# Patient Record
Sex: Male | Born: 2000 | Race: White | Hispanic: No | Marital: Single | Smoking: Never smoker
Health system: Southern US, Community
[De-identification: ages and names within clinical notes are randomized; demographics above are authoritative.]

---
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
## 2020-01-20 ENCOUNTER — Ambulatory Visit (INDEPENDENT_AMBULATORY_CARE_PROVIDER_SITE_OTHER): Payer: No Typology Code available for payment source | Admitting: Family

## 2020-01-20 ENCOUNTER — Ambulatory Visit: Payer: No Typology Code available for payment source | Admitting: Family

## 2020-01-20 ENCOUNTER — Encounter: Payer: Self-pay | Admitting: Family

## 2020-01-20 DIAGNOSIS — M25561 Pain in right knee: Secondary | ICD-10-CM

## 2020-01-20 DIAGNOSIS — R5383 Other fatigue: Secondary | ICD-10-CM | POA: Diagnosis not present

## 2020-01-20 NOTE — Progress Notes (Signed)
   Virtual Visit via telephone Note Due to COVID-19 pandemic this visit was conducted virtually. This visit type was conducted due to national recommendations for restrictions regarding the COVID-19 Pandemic (e.g. social distancing, sheltering in place) in an effort to limit this patient's exposure and mitigate transmission in our community. All issues noted in this document were discussed and addressed.  A physical exam was not performed with this format.  I connected with Jonathan Ford on 01/20/20 at 12:34 pm  by telephone and verified that I am speaking with the correct person using two identifiers. Jonathan Ford is currently located at home and no one is currently with  Him  during visit. The provider, Evelina Dun, FNP is located in their office at time of visit.  I discussed the limitations, risks, security and privacy concerns of performing an evaluation and management service by telephone and the availability of in person appointments. I also discussed with the patient that there may be a patient responsible charge related to this service. The patient expressed understanding and agreed to proceed.   History and Present Illness:  PT calls the office today with chronic right knee pain that has been on and off the last year. He denies any injury, but wrestles at school.   He reports he has had an episode when riding in the car he felt "werid" and "out of body experience". He states for 5 mins he could not move or talk. He became fatigued.  Then every thing resolved and denies any problems now. Denies any changes in vision, gait, or speech. No hx of seizures.   Knee Pain  The incident occurred more than 1 week ago. Injury mechanism: no  injury that he is aware of, but wrestles. The pain is present in the right knee. The pain is at a severity of 7/10. The pain is moderate. The pain has been intermittent since onset. Pertinent negatives include no loss of motion, muscle weakness, numbness or  tingling. He reports no foreign bodies present. The symptoms are aggravated by movement. He has tried NSAIDs and rest for the symptoms. The treatment provided mild relief.    Review of Systems  Neurological: Negative for tingling and numbness.     Observations/Objective: No SOB or distress noted  Assessment and Plan: 1. Acute pain of right knee X-ray pending If negative may need MRI - CMP14+EGFR; Future - DG Knee 1-2 Views Right; Future  2. Fatigue, unspecified type Unsure what happened seizures or "car sick" Labs pending   - Anemia Profile B; Future - CMP14+EGFR; Future - TSH; Future    I discussed the assessment and treatment plan with the patient. The patient was provided an opportunity to ask questions and all were answered. The patient agreed with the plan and demonstrated an understanding of the instructions.   The patient was advised to call back or seek an in-person evaluation if the symptoms worsen or if the condition fails to improve as anticipated.  The above assessment and management plan was discussed with the patient. The patient verbalized understanding of and has agreed to the management plan. Patient is aware to call the clinic if symptoms persist or worsen. Patient is aware when to return to the clinic for a follow-up visit. Patient educated on when it is appropriate to go to the emergency department.   Time call ended:  12:45 pm   I provided 12 minutes of non-face-to-face time during this encounter.    Evelina Dun, FNP

## 2020-01-21 ENCOUNTER — Other Ambulatory Visit: Payer: No Typology Code available for payment source

## 2020-01-21 ENCOUNTER — Ambulatory Visit (INDEPENDENT_AMBULATORY_CARE_PROVIDER_SITE_OTHER): Payer: No Typology Code available for payment source

## 2020-01-21 ENCOUNTER — Other Ambulatory Visit: Payer: Self-pay

## 2020-01-21 DIAGNOSIS — M25561 Pain in right knee: Secondary | ICD-10-CM

## 2020-01-21 DIAGNOSIS — R5383 Other fatigue: Secondary | ICD-10-CM

## 2020-01-22 ENCOUNTER — Other Ambulatory Visit: Payer: Self-pay

## 2020-01-22 ENCOUNTER — Other Ambulatory Visit: Payer: No Typology Code available for payment source

## 2020-01-22 DIAGNOSIS — R509 Fever, unspecified: Secondary | ICD-10-CM

## 2020-01-22 DIAGNOSIS — J029 Acute pharyngitis, unspecified: Secondary | ICD-10-CM

## 2020-01-22 LAB — ANEMIA PROFILE B
Basophils Absolute: 0 10*3/uL (ref 0.0–0.2)
Basos: 1 %
EOS (ABSOLUTE): 0.1 10*3/uL (ref 0.0–0.4)
Eos: 2 %
Ferritin: 57 ng/mL (ref 16–124)
Folate: 2.3 ng/mL — ABNORMAL LOW (ref >3.0)
Hematocrit: 47.5 % (ref 37.5–51.0)
Hemoglobin: 15.7 g/dL (ref 13.0–17.7)
Immature Grans (Abs): 0 10*3/uL (ref 0.0–0.1)
Immature Granulocytes: 0 %
Iron Saturation: 34 % (ref 15–55)
Iron: 107 ug/dL (ref 38–169)
Lymphocytes Absolute: 1.3 10*3/uL (ref 0.7–3.1)
Lymphs: 28 %
MCH: 27.2 pg (ref 26.6–33.0)
MCHC: 33.1 g/dL (ref 31.5–35.7)
MCV: 82 fL (ref 79–97)
Monocytes Absolute: 0.5 10*3/uL (ref 0.1–0.9)
Monocytes: 10 %
Neutrophils Absolute: 2.7 10*3/uL (ref 1.4–7.0)
Neutrophils: 59 %
Platelets: 248 10*3/uL (ref 150–450)
RBC: 5.78 x10E6/uL (ref 4.14–5.80)
RDW: 14.5 % (ref 11.6–15.4)
Retic Ct Pct: 0.8 % (ref 0.6–2.6)
Total Iron Binding Capacity: 311 ug/dL (ref 250–450)
UIBC: 204 ug/dL (ref 111–343)
Vitamin B-12: 373 pg/mL (ref 232–1245)
WBC: 4.5 10*3/uL (ref 3.4–10.8)

## 2020-01-22 LAB — CMP14+EGFR
ALT: 23 IU/L (ref 0–44)
AST: 19 IU/L (ref 0–40)
Albumin/Globulin Ratio: 2.3 — ABNORMAL HIGH (ref 1.2–2.2)
Albumin: 4.5 g/dL (ref 4.1–5.2)
Alkaline Phosphatase: 131 IU/L — ABNORMAL HIGH (ref 51–125)
BUN/Creatinine Ratio: 8 — ABNORMAL LOW (ref 9–20)
BUN: 8 mg/dL (ref 6–20)
Bilirubin Total: 0.4 mg/dL (ref 0.0–1.2)
CO2: 27 mmol/L (ref 20–29)
Calcium: 9.5 mg/dL (ref 8.7–10.2)
Chloride: 100 mmol/L (ref 96–106)
Creatinine, Ser: 1.06 mg/dL (ref 0.76–1.27)
GFR calc Af Amer: 117 mL/min/{1.73_m2} (ref >59)
GFR calc non Af Amer: 101 mL/min/{1.73_m2} (ref >59)
Globulin, Total: 2 g/dL (ref 1.5–4.5)
Glucose: 72 mg/dL (ref 65–99)
Potassium: 4.1 mmol/L (ref 3.5–5.2)
Sodium: 138 mmol/L (ref 134–144)
Total Protein: 6.5 g/dL (ref 6.0–8.5)

## 2020-01-22 LAB — TSH: TSH: 1.94 u[IU]/mL (ref 0.450–4.500)

## 2020-01-22 LAB — CULTURE, GROUP A STREP

## 2020-01-22 LAB — RAPID STREP SCREEN (MED CTR MEBANE ONLY): Strep Gp A Ag, IA W/Reflex: NEGATIVE

## 2020-01-22 LAB — VERITOR FLU A/B WAIVED
Influenza A: NEGATIVE
Influenza B: NEGATIVE

## 2020-01-23 ENCOUNTER — Encounter: Payer: Self-pay | Admitting: Family

## 2020-01-23 ENCOUNTER — Ambulatory Visit (INDEPENDENT_AMBULATORY_CARE_PROVIDER_SITE_OTHER): Payer: No Typology Code available for payment source | Admitting: Family

## 2020-01-23 DIAGNOSIS — J069 Acute upper respiratory infection, unspecified: Secondary | ICD-10-CM | POA: Diagnosis not present

## 2020-01-23 LAB — NOVEL CORONAVIRUS, NAA: SARS-CoV-2, NAA: NOT DETECTED

## 2020-01-23 LAB — SARS-COV-2, NAA 2 DAY TAT

## 2020-01-23 MED ORDER — FLUTICASONE PROPIONATE 50 MCG/ACT NA SUSP: 2.0000 | Freq: Every day | NASAL | 6 refills | Status: DC

## 2020-01-23 MED ORDER — BENZONATATE 200 MG PO CAPS: 200.0000 mg | ORAL_CAPSULE | Freq: Three times a day (TID) | ORAL | 1 refills | Status: DC | PRN

## 2020-01-23 NOTE — Progress Notes (Signed)
   Virtual Visit via telephone Note Due to COVID-19 pandemic this visit was conducted virtually. This visit type was conducted due to national recommendations for restrictions regarding the COVID-19 Pandemic (e.g. social distancing, sheltering in place) in an effort to limit this patient's exposure and mitigate transmission in our community. All issues noted in this document were discussed and addressed.  A physical exam was not performed with this format.  I connected with Jonathan Ford on 01/23/20 at 9:03 AM  by telephone and verified that I am speaking with the correct person using two identifiers. Jonathan Ford is currently located at home and no one is currently with him  during visit. The provider, Jannifer Rodney, FNP is located in their office at time of visit.  I discussed the limitations, risks, security and privacy concerns of performing an evaluation and management service by telephone and the availability of in person appointments. I also discussed with the patient that there may be a patient responsible charge related to this service. The patient expressed understanding and agreed to proceed.   History and Present Illness:  Pt calls the office today with sore throat and cough that started yesterday. He was strep and flu negative. His COVID test is still pending. Sore Throat  This is a new problem. The current episode started yesterday. The problem has been gradually worsening. Maximum temperature: reports 103. The pain is at a severity of 4/10. The pain is moderate. Associated symptoms include congestion, coughing, headaches, a hoarse voice and swollen glands. Pertinent negatives include no ear discharge, ear pain, shortness of breath or trouble swallowing. He has had no exposure to strep. He has tried acetaminophen and NSAIDs for the symptoms. The treatment provided mild relief.       Review of Systems  HENT: Positive for congestion and hoarse voice. Negative for ear discharge, ear  pain and trouble swallowing.   Respiratory: Positive for cough. Negative for shortness of breath.   Neurological: Positive for headaches.  All other systems reviewed and are negative.    Observations/Objective: No SOB or distress noted, hoarse voice  Assessment and Plan: 1. Viral URI COVID still pending Quarantine until results return Force fluids Rest Tylenol as needed  - benzonatate (TESSALON) 200 MG capsule; Take 1 capsule (200 mg total) by mouth 3 (three) times daily as needed.  Dispense: 30 capsule; Refill: 1 - fluticasone (FLONASE) 50 MCG/ACT nasal spray; Place 2 sprays into both nostrils daily.  Dispense: 16 g; Refill: 6     I discussed the assessment and treatment plan with the patient. The patient was provided an opportunity to ask questions and all were answered. The patient agreed with the plan and demonstrated an understanding of the instructions.   The patient was advised to call back or seek an in-person evaluation if the symptoms worsen or if the condition fails to improve as anticipated.  The above assessment and management plan was discussed with the patient. The patient verbalized understanding of and has agreed to the management plan. Patient is aware to call the clinic if symptoms persist or worsen. Patient is aware when to return to the clinic for a follow-up visit. Patient educated on when it is appropriate to go to the emergency department.   Time call ended:  9:15 AM   I provided 12 minutes of non-face-to-face time during this encounter.    Jannifer Rodney, FNP

## 2020-01-24 ENCOUNTER — Telehealth: Payer: Self-pay

## 2020-01-24 MED ORDER — AMOXICILLIN 500 MG PO CAPS: 500.0000 mg | ORAL_CAPSULE | Freq: Two times a day (BID) | ORAL | 0 refills | Status: AC

## 2020-01-24 NOTE — Telephone Encounter (Signed)
Amoxicillin Prescription sent to pharmacy. Please make sure he gets a new toothbrush in  3 days.

## 2020-01-24 NOTE — Telephone Encounter (Signed)
Mother aware

## 2021-01-04 ENCOUNTER — Encounter: Payer: Self-pay | Admitting: Family

## 2021-01-04 ENCOUNTER — Ambulatory Visit (INDEPENDENT_AMBULATORY_CARE_PROVIDER_SITE_OTHER): Payer: No Typology Code available for payment source | Admitting: Family

## 2021-01-04 VITALS — BP 127/74 | HR 54 | Temp 98.1°F | Ht 73.0 in | Wt 199.0 lb

## 2021-01-04 DIAGNOSIS — M25562 Pain in left knee: Secondary | ICD-10-CM | POA: Diagnosis not present

## 2021-01-04 DIAGNOSIS — G8929 Other chronic pain: Secondary | ICD-10-CM

## 2021-01-04 DIAGNOSIS — Y9372 Activity, wrestling: Secondary | ICD-10-CM

## 2021-01-04 MED ORDER — DICLOFENAC SODIUM 75 MG PO TBEC: 75.0000 mg | DELAYED_RELEASE_TABLET | Freq: Two times a day (BID) | ORAL | 1 refills | Status: DC

## 2021-01-04 NOTE — Patient Instructions (Signed)
Chronic Knee Pain, Adult °Chronic knee pain is pain in one or both knees that lasts longer than 3 months. Symptoms of chronic knee pain may include swelling, stiffness, and discomfort. Age-related wear and tear (osteoarthritis) of the knee joint is the most common cause of chronic knee pain. Other possible causes include: °A long-term immune-related disease that causes inflammation of the knee (rheumatoid arthritis). This usually affects both knees. °Inflammatory arthritis, such as gout or pseudogout. °An injury to the knee that causes arthritis. °An injury to the knee that damages the ligaments. Ligaments are strong tissues that connect bones to each other. °Runner's knee or pain behind the kneecap. °Treatment for chronic knee pain depends on the cause. The main treatments for chronic knee pain are physical therapy and weight loss. This condition may also be treated with medicines, injections, a knee sleeve or brace, and by using crutches. Rest, ice, pressure (compression), and elevation, also known as RICE therapy, may also be recommended. °Follow these instructions at home: °If you have a knee sleeve or brace: ° °Wear the knee sleeve or brace as told by your health care provider. Remove it only as told by your health care provider. °Loosen it if your toes tingle, become numb, or turn cold and blue. °Keep it clean. °If the sleeve or brace is not waterproof: °Do not let it get wet. °Remove it if allowed by your health care provider, or cover it with a watertight covering when you take a bath or a shower. °Managing pain, stiffness, and swelling °  °If directed, apply heat to the affected area as often as told by your health care provider. Use the heat source that your health care provider recommends, such as a moist heat pack or a heating pad. °If you have a removable knee sleeve or brace, remove it as told by your health care provider. °Place a towel between your skin and the heat source. °Leave the heat on for  20-30 minutes. °Remove the heat if your skin turns bright red. This is especially important if you are unable to feel pain, heat, or cold. You may have a greater risk of getting burned. °If directed, put ice on the affected area. To do this: °If you have a removable knee sleeve or brace, remove it as told by your health care provider. °Put ice in a plastic bag. °Place a towel between your skin and the bag. °Leave the ice on for 20 minutes, 2-3 times a day. °Remove the ice if your skin turns bright red. This is very important. If you cannot feel pain, heat, or cold, you have a greater risk of damage to the area. °Move your toes often to reduce stiffness and swelling. °Raise (elevate) the injured area above the level of your heart while you are sitting or lying down. °Activity °Avoid high-impact activities or exercises, such as running, jumping rope, or doing jumping jacks. °Follow the exercise plan that your health care provider designed for you. Your health care provider may suggest that you: °Avoid activities that make knee pain worse. This may require you to change your exercise routines, sport participation, or job duties. °Wear shoes with cushioned soles. °Avoid sports that require running and sudden changes in direction. °Do physical therapy. Physical therapy is planned to match your needs and abilities. It may include exercises for strength, flexibility, stability, and endurance. °Do exercises that increase balance and strength, such as tai chi and yoga. °Do not use the injured limb to support your body weight   until your health care provider says that you can. Use crutches as told by your health care provider. °Return to your normal activities as told by your health care provider. Ask your health care provider what activities are safe for you. °General instructions °Take over-the-counter and prescription medicines only as told by your health care provider. °Lose weight if you are overweight. Losing even a  little weight can reduce knee pain. Ask your health care provider what your ideal weight is, and how to safely lose extra weight. A dietitian may be able to help you plan your meals. °Do not use any products that contain nicotine or tobacco, such as cigarettes, e-cigarettes, and chewing tobacco. These can delay healing. If you need help quitting, ask your health care provider. °Keep all follow-up visits. This is important. °Contact a health care provider if: °You have knee pain that is not getting better or gets worse. °You are unable to do your physical therapy exercises due to knee pain. °Get help right away if: °Your knee swells and the swelling becomes worse. °You cannot move your knee. °You have severe knee pain. °Summary °Knee pain that lasts more than 3 months is considered chronic knee pain. °The main treatments for chronic knee pain are physical therapy and weight loss. You may also need to take medicines, wear a knee sleeve or brace, use crutches, and apply ice or heat. °Losing even a little weight can reduce knee pain. Ask your health care provider what your ideal weight is, and how to safely lose extra weight. A dietitian may be able to help you plan your meals. °Follow the exercise plan that your health care provider designed for you. °This information is not intended to replace advice given to you by your health care provider. Make sure you discuss any questions you have with your health care provider. °Document Revised: 06/19/2019 Document Reviewed: 06/19/2019 °Elsevier Patient Education © 2022 [employer] ° °

## 2021-01-04 NOTE — Progress Notes (Signed)
Subjective:    Patient ID: Jonathan Ford, male    DOB: 2000/12/14, 20 y.o.   MRN: [id number]  Chief Complaint  Patient presents with   Knee Pain    Both knees   Pt presents to the office today with left knee pain that has been on going on and off over the last two years. 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 Pain is worse when walking down stairs. He has taken NSAID's without relief and has done stretching without relief.   Knee Pain  The incident occurred more than 1 week ago. Injury mechanism: Wrestling. The pain is present in the left knee. The quality of the pain is described as aching. The pain is at a severity of 7/10. The pain is moderate. The pain has been Intermittent since onset. Pertinent negatives include no inability to bear weight, loss of motion, loss of sensation, numbness or tingling. He reports no foreign bodies present. The symptoms are aggravated by weight bearing (walking down stairs). He has tried NSAIDs for the symptoms. The treatment provided mild relief.     Review of Systems  Neurological:  Negative for tingling and numbness.  All other systems reviewed and are negative.     Objective:   Physical Exam Vitals reviewed.  Constitutional:      General: He is not in acute distress.    Appearance: He is well-developed.  HENT:     Head: Normocephalic.  Eyes:     General:        Right eye: No discharge.        Left eye: No discharge.     Pupils: Pupils are equal, round, and reactive to light.  Neck:     Thyroid: No thyromegaly.  Cardiovascular:     Rate and Rhythm: Normal rate and regular rhythm.     Heart sounds: Normal heart sounds. No murmur heard. Pulmonary:     Effort: Pulmonary effort is normal. No respiratory distress.     Breath sounds: Normal breath sounds. No wheezing.  Abdominal:     General: Bowel sounds are normal. There is no distension.     Palpations: Abdomen is soft.     Tenderness: There is no abdominal tenderness.  Musculoskeletal:         General: No tenderness. Normal range of motion.     Cervical back: Normal range of motion and neck supple.     Comments: Full ROM, pain in posterior knee with flexion and extension   Skin:    General: Skin is warm and dry.     Findings: No erythema or rash.  Neurological:     Mental Status: He is alert and oriented to person, place, and time.     Cranial Nerves: No cranial nerve deficit.     Deep Tendon Reflexes: Reflexes are normal and symmetric.  Psychiatric:        Behavior: Behavior normal.        Thought Content: Thought content normal.        Judgment: Judgment normal.     BP 127/74    Pulse (!) 54    Temp 98.1 F (36.7 C) (Temporal)    Ht 6\' 1"  (1.854 m)    Wt 199 lb (90.3 kg)    BMI 26.25 kg/m       Assessment & Plan:   Jonathan Ford comes in today with chief complaint of Knee Pain (Both knees )   Diagnosis and orders addressed:  1. Chronic pain of left  knee - MR Knee Left  Wo Contrast; Future - diclofenac (VOLTAREN) 75 MG EC tablet; Take 1 tablet (75 mg total) by mouth 2 (two) times daily.  Dispense: 60 tablet; Refill: 1  2. Injury while wrestling - MR Knee Left  Wo Contrast; Future - diclofenac (VOLTAREN) 75 MG EC tablet; Take 1 tablet (75 mg total) by mouth 2 (two) times daily.  Dispense: 60 tablet; Refill: 1  Given length of pain, will order MRI Start diclofenac BID  No other NSAIDs Follow up if symptoms worsen or do not improve   Jannifer Rodney, FNP

## 2021-01-20 ENCOUNTER — Other Ambulatory Visit: Payer: Self-pay

## 2021-01-20 ENCOUNTER — Ambulatory Visit (HOSPITAL_COMMUNITY)
Admission: RE | Admit: 2021-01-20 | Discharge: 2021-01-20 | Disposition: A | Discharge status: Home / Self Care | Payer: No Typology Code available for payment source | Source: Ambulatory Visit | Attending: Family | Admitting: Family

## 2021-01-20 DIAGNOSIS — Y9372 Activity, wrestling: Secondary | ICD-10-CM | POA: Insufficient documentation

## 2021-01-20 DIAGNOSIS — G8929 Other chronic pain: Secondary | ICD-10-CM | POA: Insufficient documentation

## 2021-01-20 DIAGNOSIS — M25562 Pain in left knee: Secondary | ICD-10-CM | POA: Diagnosis not present

## 2021-01-20 DIAGNOSIS — S83242A Other tear of medial meniscus, current injury, left knee, initial encounter: Secondary | ICD-10-CM | POA: Insufficient documentation

## 2021-01-21 ENCOUNTER — Other Ambulatory Visit: Payer: Self-pay | Admitting: Family

## 2021-01-21 DIAGNOSIS — S83249A Other tear of medial meniscus, current injury, unspecified knee, initial encounter: Secondary | ICD-10-CM

## 2021-01-27 ENCOUNTER — Ambulatory Visit: Payer: No Typology Code available for payment source | Attending: Family Medicine

## 2021-01-27 ENCOUNTER — Other Ambulatory Visit: Payer: Self-pay

## 2021-01-27 DIAGNOSIS — G8929 Other chronic pain: Secondary | ICD-10-CM | POA: Insufficient documentation

## 2021-01-27 DIAGNOSIS — M25562 Pain in left knee: Secondary | ICD-10-CM | POA: Diagnosis present

## 2021-01-27 NOTE — Therapy (Addendum)
[facility] [place], [place], [address] Phone: [phone]   Fax:  [phone]  Physical Therapy Evaluation  Patient Details  Name: Jonathan Ford MRN: [id number] Date of Birth: November 26, 2000 Referring Provider (PT): Garlan Fillers, MD   Encounter Date: 01/27/2021   PT End of Session - 01/27/21 1258     Visit Number 1    Number of Visits 1    PT Start Time 9528    PT Stop Time 4132    PT Time Calculation (min) 48 min    Activity Tolerance Patient tolerated treatment well;No increased pain    Behavior During Therapy [facility] for tasks assessed/performed             History reviewed. No pertinent past medical history.  History reviewed. No pertinent surgical history.  There were no vitals filed for this visit.    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 He notes that his pain has gotten substantially worse in the past month and a half. He is unsure of why his pain got worse about 6 weeks ago. He feels that his left knee may give out sometimes, but it has yet to do so.    Limitations Standing    Patient Stated Goals reduced pain, improved strength    Currently in Pain? Yes    Pain Score 8     Pain Location Knee    Pain Orientation Left;Anterior;Posterior    Pain Descriptors / Indicators Aching;Sharp;Shooting    Pain Type Chronic pain    Pain Radiating Towards radiates up his left IT band    Pain Onset More than a month ago    Pain Frequency Intermittent    Aggravating Factors  prolonged standing, sitting, walking down stairs    Pain Relieving Factors rest, laying down    Effect of Pain on Daily Activities able to do everything with increased difficulty                [facility] PT Assessment - 01/27/21 0001        Assessment   Medical Diagnosis Patellofemoral stress syndrome    Referring Provider (PT) Garlan Fillers, MD    Onset Date/Surgical Date --   2 years ago   Next MD Visit not scheduled yet    Prior Therapy No      Precautions   Precautions None      Restrictions   Weight Bearing Restrictions No      Balance Screen   Has the patient fallen in the past 6 months No    Has the patient had a decrease in activity level because of a fear of falling?  No    Is the patient reluctant to leave their home because of a fear of falling?  No      Home Ecologist residence      Prior Function   Level of Independence Independent    Vocation Student    Leisure work out, Secondary school teacher   Overall Cognitive Status Within Functional Limits for tasks assessed    Attention Focused    Focused Attention Appears intact    Memory Appears intact    Awareness Appears intact    Problem Solving Appears intact  Sensation   Additional Comments Patient reports no numbness or tingling      ROM / Strength   AROM / PROM / Strength Strength;AROM      AROM   Overall AROM  Within functional limits for tasks performed    Overall AROM Comments No limitations with left knee ROM: pain at end range knee flexion    AROM Assessment Site Knee      Strength   Strength Assessment Site Knee    Right/Left Knee Left;Right    Right Knee Flexion 5/5    Right Knee Extension 5/5    Left Knee Flexion 5/5   posterior knee pain   Left Knee Extension 4/5      Palpation   Patella mobility Left: WFL and nonpainful   Tibiofemoral: WFL and nonpainful   Palpation comment TTP: left hamstring and proximal gastroc      Ambulation/Gait   Ambulation/Gait Yes    Ambulation/Gait Assistance 7: Independent    Gait Pattern Within Functional Limits    Ambulation Surface Level;Indoor                        Objective measurements completed on examination: See above findings.        [facility] PT Treatment/Exercise - 01/27/21 0001       Exercises   Exercises Knee/Hip      Knee/Hip Exercises: Stretches   Passive Hamstring Stretch Left;3 reps;30 seconds    Gastroc Stretch Left;3 reps;30 seconds      Knee/Hip Exercises: Aerobic   Recumbent Bike L7 x 5 minutes      Knee/Hip Exercises: Standing   Forward Lunges Both;15 reps    Lateral Step Up Left;15 reps;Hand Hold: 0;Step Height: 6"    Functional Squat 15 reps   single leg; LLE only   Other Standing Knee Exercises Side stepping   green t-band; 2 minutes   Other Standing Knee Exercises Monster walk   green t-band; 2 minutes                    PT Education - 01/27/21 1404     Education Details ways to progress his HEP and multiple ways to modify his HEP, as needed    Person(s) Educated Patient    Methods Explanation    Comprehension Verbalized understanding                 PT Long Term Goals - 01/27/21 1409       PT LONG TERM GOAL #1   Title Patient will be able to safely demonstrate his HEP    Status Achieved                    Plan - 01/27/21 1406     Clinical Impression Statement Patient is a 21 year old male presenting to physical therapy with chronic left knee pain secondary to a meniscal tear and patellofemoral stress syndrome. He has experienced this pain for more than two years, but he experienced an exacerbation in these symptoms approximately six weeks ago with no known cause. He was provided a substantial HEP as he is only able to attend the initial evaluation as he returns to school this weekend and his insurance is out of network for the local physical therapy offices. He was able to safely and properly demonstrate his HEP without any exacerbation to his familiar symptoms. He was educated on ways to modify and progress his HEP  and how to safely return to his gym. He reported feeling comfortable with these interventions and being discharged at this time.     Personal Factors and Comorbidities Other;Time since onset of injury/illness/exacerbation    Examination-Activity Limitations Locomotion Level;Stairs;Sit;Lift    Examination-Participation Restrictions School;Community Activity    Stability/Clinical Decision Making Stable/Uncomplicated    Clinical Decision Making Low    Rehab Potential Good    PT Frequency 1x / week    PT Duration Other (comment)   1 week   PT Treatment/Interventions Therapeutic exercise;Therapeutic activities;Stair training;Patient/family education    PT Home Exercise Plan [place] access code: [email or web address]    Consulted and Agree with Plan of Care Patient             Patient will benefit from skilled therapeutic intervention in order to improve the following deficits and impairments:  Decreased activity tolerance, Pain, Decreased strength, Decreased mobility  Visit Diagnosis: Chronic pain of left knee     Problem List Patient Active Problem List   Diagnosis Date Noted   Concussion with no loss of consciousness 12/24/2015   History of concussion 12/24/2015    Darlin Coco, PT 01/27/2021, 2:30 PM  [facility] [place], [place], [address] Phone: [phone]   Fax:  [phone]  Name: Jonathan Ford MRN: [id number] Date of Birth: [birth year]  PHYSICAL THERAPY DISCHARGE SUMMARY  Visits from Start of Care: 1  Current functional level related to goals / functional outcomes: See clinical impression statement    Remaining deficits: See clinical impression statement    Education / Equipment: HEP    Patient agrees to discharge. Patient goals were met. Patient is being discharged due to meeting the stated rehab goals.

## 2021-03-12 ENCOUNTER — Ambulatory Visit (INDEPENDENT_AMBULATORY_CARE_PROVIDER_SITE_OTHER): Payer: No Typology Code available for payment source | Admitting: Family Medicine

## 2021-03-12 ENCOUNTER — Encounter: Payer: Self-pay | Admitting: Family Medicine

## 2021-03-12 DIAGNOSIS — L0291 Cutaneous abscess, unspecified: Secondary | ICD-10-CM

## 2021-03-12 MED ORDER — SULFAMETHOXAZOLE-TRIMETHOPRIM 800-160 MG PO TABS: 1.0000 | ORAL_TABLET | Freq: Two times a day (BID) | ORAL | 0 refills | Status: AC

## 2021-03-12 NOTE — Progress Notes (Signed)
° °  Virtual Visit via Telephone Note  I connected with Jonathan Ford on 03/12/21 at 3:12 PM by telephone and verified that I am speaking with the correct person using two identifiers. Jonathan Ford is currently located in his vehicle and nobody is currently with him during this visit. The provider, Loman Brooklyn, FNP is located in their office at time of visit.  I discussed the limitations, risks, security and privacy concerns of performing an evaluation and management service by telephone and the availability of in person appointments. I also discussed with the patient that there may be a patient responsible charge related to this service. The patient expressed understanding and agreed to proceed.  Subjective: PCP: Janora Norlander, DO  Chief Complaint  Patient presents with   Cyst   Patient reports he believes he has a pilonidal cyst at the top of his intergluteal cleft. He reports similar symptoms two years ago which resolved spontaneously. He is having swelling, pain, and drainage that is sometimes purulent and sometimes bloody.   ROS: Per HPI  Current Outpatient Medications:    diclofenac (VOLTAREN) 75 MG EC tablet, Take 1 tablet (75 mg total) by mouth 2 (two) times daily., Disp: 60 tablet, Rfl: 1  No Known Allergies History reviewed. No pertinent past medical history.  Observations/Objective: A&O  No respiratory distress or wheezing audible over the phone Mood, judgement, and thought processes all WNL   Assessment and Plan: 1. Abscess Discussed if this is a pilonidal cyst he may need to have the cyst surgically removed. He is away at college currently and would like to wait to have this completed until he is back home. Antibiotics started today.  - sulfamethoxazole-trimethoprim (BACTRIM DS) 800-160 MG tablet; Take 1 tablet by mouth 2 (two) times daily for 7 days.  Dispense: 14 tablet; Refill: 0   Follow Up Instructions:  I discussed the assessment and treatment plan  with the patient. The patient was provided an opportunity to ask questions and all were answered. The patient agreed with the plan and demonstrated an understanding of the instructions.   The patient was advised to call back or seek an in-person evaluation if the symptoms worsen or if the condition fails to improve as anticipated.  The above assessment and management plan was discussed with the patient. The patient verbalized understanding of and has agreed to the management plan. Patient is aware to call the clinic if symptoms persist or worsen. Patient is aware when to return to the clinic for a follow-up visit. Patient educated on when it is appropriate to go to the emergency department.   Time call ended: 3:23 PM  I provided 11 minutes of non-face-to-face time during this encounter.  Hendricks Limes, MSN, APRN, FNP-C [facility] 03/12/21

## 2021-03-29 ENCOUNTER — Encounter: Payer: Self-pay | Admitting: Family Medicine

## 2021-03-29 ENCOUNTER — Ambulatory Visit (INDEPENDENT_AMBULATORY_CARE_PROVIDER_SITE_OTHER): Payer: No Typology Code available for payment source | Admitting: Family Medicine

## 2021-03-29 VITALS — BP 118/55 | HR 54 | Temp 97.3°F | Ht 73.0 in | Wt 198.8 lb

## 2021-03-29 DIAGNOSIS — L0501 Pilonidal cyst with abscess: Secondary | ICD-10-CM | POA: Diagnosis not present

## 2021-03-29 MED ORDER — AMOXICILLIN-POT CLAVULANATE 875-125 MG PO TABS: 1.0000 | ORAL_TABLET | Freq: Two times a day (BID) | ORAL | 0 refills | Status: DC

## 2021-03-29 NOTE — Progress Notes (Signed)
? ?Subjective:  ?Patient ID: Jonathan Ford, male    DOB: [birth year]  Age: 21 y.o. MRN: [id number] ? ?CC: Cyst (pilonidal) ? ? ?HPI ?Jonathan Ford presents for pilonidal cyst. Treated 3 weeks ago with sulfa. No relief. First noted 2 years ago. Returned 4 weeks ago. Irritated. Draining sticky fluids and sometimes bloody. Not painful. It is annoying.  ? ?Depression screen [facility] 2/9 03/29/2021 10/28/2019 06/20/2019  ?Decreased Interest 0 0 0  ?Down, Depressed, Hopeless 0 0 0  ?PHQ - 2 Score 0 0 0  ?Altered sleeping - - -  ?Tired, decreased energy - - -  ?Change in appetite - - -  ?Feeling bad or failure about yourself  - - -  ?Trouble concentrating - - -  ?Moving slowly or fidgety/restless - - -  ?Suicidal thoughts - - -  ?PHQ-9 Score - - -  ?Difficult doing work/chores - - -  ? ? ?History ?Jonathan Ford has no past medical history on file.  ? ?He has no past surgical history on file.  ? ?His family history includes Arthritis in his maternal grandfather; Healthy in his father and mother; Hyperlipidemia in his paternal grandmother; Hypertension in his paternal grandfather and paternal grandmother; Stroke in his paternal grandmother.He reports that he has never smoked. He has never used smokeless tobacco. He reports current alcohol use. He reports that he does not use drugs. ? ? ? ?ROS ?Review of Systems  ?Constitutional:  Negative for fever.  ?Respiratory:  Negative for shortness of breath.   ?Cardiovascular:  Negative for chest pain.  ?Musculoskeletal:  Negative for arthralgias.  ?Skin:  Negative for rash.  ? ?Objective:  ?BP (!) 118/55   Pulse (!) 54   Temp (!) 97.3 ?F (36.3 ?C)   Ht 6\' 1"  (1.854 m)   Wt 198 lb 12.8 oz (90.2 kg)   SpO2 100%   BMI 26.23 kg/m?  ? ?BP Readings from Last 3 Encounters:  ?03/29/21 (!) 118/55  ?01/04/21 127/74  ?10/28/19 138/77  ? ? ?Wt Readings from Last 3 Encounters:  ?03/29/21 198 lb 12.8 oz (90.2 kg)  ?01/04/21 199 lb (90.3 kg)  ?10/28/19 193 lb 6.4 oz (87.7 kg) (91 %, Z= 1.32)*  ? ?* Growth  percentiles are based on CDC (Boys, 2-20 Years) data.  ? ? ? ?Physical Exam ?Constitutional:   ?   Appearance: Normal appearance.  ?HENT:  ?   Head: Normocephalic.  ?Skin: ?   General: Skin is warm and dry.  ?   Findings: Lesion present. Rash: typical pilonidat cyst.About 3 mm diameter Bottom could not be visualized. Some mucoid material expressed.12/28/19 ?Neurological:  ?   Mental Status: He is alert.  ? ? ? ? ?Assessment & Plan:  ? ?Jonathan Ford was seen today for cyst. ? ?Diagnoses and all orders for this visit: ? ?Pilonidal cyst with abscess ?-     Ambulatory referral to General Surgery ? ?Other orders ?-     amoxicillin-clavulanate (AUGMENTIN) 875-125 MG tablet; Take 1 tablet by mouth 2 (two) times daily. Take all of this medication ? ? ? ?I have discontinued Nassim Doty's diclofenac. I am also having him start on amoxicillin-clavulanate. ? ?Allergies as of 03/29/2021   ?No Known Allergies ?  ? ?  ?Medication List  ?  ? ?  ? Accurate as of March 29, 2021  3:31 PM. If you have any questions, ask your nurse or doctor.  ?  ?  ? ?  ? ?STOP taking these medications   ? ?diclofenac  75 MG EC tablet ?Commonly known as: VOLTAREN ?Stopped by: Mechele Claude, MD ?  ? ?  ? ?TAKE these medications   ? ?amoxicillin-clavulanate 875-125 MG tablet ?Commonly known as: AUGMENTIN ?Take 1 tablet by mouth 2 (two) times daily. Take all of this medication ?Started by: Mechele Claude, MD ?  ? ?  ? ? ? ?Follow-up: Return if symptoms worsen or fail to improve. ? ?Mechele Claude, M.D. ?

## 2021-04-01 ENCOUNTER — Ambulatory Visit (INDEPENDENT_AMBULATORY_CARE_PROVIDER_SITE_OTHER): Payer: No Typology Code available for payment source | Admitting: General Surgery

## 2021-04-01 ENCOUNTER — Encounter: Payer: Self-pay | Admitting: General Surgery

## 2021-04-01 ENCOUNTER — Other Ambulatory Visit: Payer: Self-pay

## 2021-04-01 VITALS — BP 119/73 | HR 52 | Temp 98.3°F | Resp 12 | Ht 73.0 in | Wt 202.0 lb

## 2021-04-01 DIAGNOSIS — L0591 Pilonidal cyst without abscess: Secondary | ICD-10-CM | POA: Diagnosis not present

## 2021-04-02 NOTE — Progress Notes (Signed)
Jonathan Ford; [id number]; Jul 20, 2000 ? ? ?HPI ?Patient is a 21 year old white male who was referred to my care by [name] for evaluation and treatment of a pilonidal cyst.  Patient states he recently had some drainage from the pilonidal region.  He has been treated with 2 rounds of antibiotics.  It has currently gotten better and not draining as much as it was.  He denies any fevers.  He states this occurred approximately 1 year ago.  He is currently in college and on spring break.  He is currently on Augmentin and should complete the course shortly. ?History reviewed. No pertinent past medical history. ? ?History reviewed. No pertinent surgical history. ? ?Family History  ?Problem Relation Age of Onset  ? Arthritis Maternal Grandfather   ? Hyperlipidemia Paternal Grandmother   ? Hypertension Paternal Grandmother   ? Stroke Paternal Grandmother   ? Hypertension Paternal Grandfather   ? Healthy Mother   ? Healthy Father   ? ? ?Current Outpatient Medications on File Prior to Visit  ?Medication Sig Dispense Refill  ? amoxicillin-clavulanate (AUGMENTIN) 875-125 MG tablet Take 1 tablet by mouth 2 (two) times daily. Take all of this medication 20 tablet 0  ? ?No current facility-administered medications on file prior to visit.  ? ? ?No Known Allergies ? ?Social History  ? ?Substance and Sexual Activity  ?Alcohol Use Yes  ? Comment: rare  ? ? ?Social History  ? ?Tobacco Use  ?Smoking Status Never  ?Smokeless Tobacco Never  ? ? ?Review of Systems  ?Constitutional: Negative.   ?HENT: Negative.    ?Eyes: Negative.   ?Respiratory: Negative.    ?Cardiovascular: Negative.   ?Gastrointestinal: Negative.   ?Genitourinary: Negative.   ?Musculoskeletal: Negative.   ?Skin: Negative.   ?Neurological: Negative.   ?Endo/Heme/Allergies: Negative.   ?Psychiatric/Behavioral: Negative.    ? ?Objective  ? ?Vitals:  ? 04/01/21 1106  ?BP: 119/73  ?Pulse: (!) 52  ?Resp: 12  ?Temp: 98.3 ?F (36.8 ?C)  ?SpO2: 98%  ? ? ?Physical  Exam ?Vitals reviewed.  ?Constitutional:   ?   Appearance: Normal appearance. He is normal weight. He is not ill-appearing.  ?HENT:  ?   Head: Normocephalic and atraumatic.  ?Cardiovascular:  ?   Rate and Rhythm: Normal rate and regular rhythm.  ?   Heart sounds: Normal heart sounds. No murmur heard. ?  No friction rub. No gallop.  ?Pulmonary:  ?   Effort: Pulmonary effort is normal. No respiratory distress.  ?   Breath sounds: Normal breath sounds. No stridor. No wheezing, rhonchi or rales.  ?Skin: ?   General: Skin is warm and dry.  ?   Comments: Small pilonidal cyst present over the coccyx.  Mild induration present which extends less than 1 cm from the opening.  No purulent drainage present.  Mild erythema noted.  Hair is present in the area.  ?Neurological:  ?   Mental Status: He is alert and oriented to person, place, and time.  ? ? ?Assessment  ?Recurrent pilonidal cyst with resolving infection ?Plan  ?I told the patient that he will probably need excision of pilonidal cyst in the future as it has recurred.  He would like to do this once he is finished school, which is fine with me.  I told him to call me should he have any further flareups.  He will call back to schedule the surgery.  The risks and benefits of the procedure were fully explained to the patient, who gave informed  consent. ?

## 2021-06-29 NOTE — H&P (Signed)
Jonathan Ford; [id number]; 05/15/2000 ? ? ?HPI ?Patient is a 21-year-old white male who was referred to my care by Ashly Gottschalk for evaluation and treatment of a pilonidal cyst.  Patient states he recently had some drainage from the pilonidal region.  He has been treated with 2 rounds of antibiotics.  It has currently gotten better and not draining as much as it was.  He denies any fevers.  He states this occurred approximately 1 year ago.  He is currently in college and on spring break.  He is currently on Augmentin and should complete the course shortly. ?History reviewed. No pertinent past medical history. ? ?History reviewed. No pertinent surgical history. ? ?Family History  ?Problem Relation Age of Onset  ? Arthritis Maternal Grandfather   ? Hyperlipidemia Paternal Grandmother   ? Hypertension Paternal Grandmother   ? Stroke Paternal Grandmother   ? Hypertension Paternal Grandfather   ? Healthy Mother   ? Healthy Father   ? ? ?Current Outpatient Medications on File Prior to Visit  ?Medication Sig Dispense Refill  ? amoxicillin-clavulanate (AUGMENTIN) 875-125 MG tablet Take 1 tablet by mouth 2 (two) times daily. Take all of this medication 20 tablet 0  ? ?No current facility-administered medications on file prior to visit.  ? ? ?No Known Allergies ? ?Social History  ? ?Substance and Sexual Activity  ?Alcohol Use Yes  ? Comment: rare  ? ? ?Social History  ? ?Tobacco Use  ?Smoking Status Never  ?Smokeless Tobacco Never  ? ? ?Review of Systems  ?Constitutional: Negative.   ?HENT: Negative.    ?Eyes: Negative.   ?Respiratory: Negative.    ?Cardiovascular: Negative.   ?Gastrointestinal: Negative.   ?Genitourinary: Negative.   ?Musculoskeletal: Negative.   ?Skin: Negative.   ?Neurological: Negative.   ?Endo/Heme/Allergies: Negative.   ?Psychiatric/Behavioral: Negative.    ? ?Objective  ? ?Vitals:  ? 04/01/21 1106  ?BP: 119/73  ?Pulse: (!) 52  ?Resp: 12  ?Temp: 98.3 ?F (36.8 ?C)  ?SpO2: 98%  ? ? ?Physical  Exam ?Vitals reviewed.  ?Constitutional:   ?   Appearance: Normal appearance. He is normal weight. He is not ill-appearing.  ?HENT:  ?   Head: Normocephalic and atraumatic.  ?Cardiovascular:  ?   Rate and Rhythm: Normal rate and regular rhythm.  ?   Heart sounds: Normal heart sounds. No murmur heard. ?  No friction rub. No gallop.  ?Pulmonary:  ?   Effort: Pulmonary effort is normal. No respiratory distress.  ?   Breath sounds: Normal breath sounds. No stridor. No wheezing, rhonchi or rales.  ?Skin: ?   General: Skin is warm and dry.  ?   Comments: Small pilonidal cyst present over the coccyx.  Mild induration present which extends less than 1 cm from the opening.  No purulent drainage present.  Mild erythema noted.  Hair is present in the area.  ?Neurological:  ?   Mental Status: He is alert and oriented to person, place, and time.  ? ? ?Assessment  ?Recurrent pilonidal cyst with resolving infection ?Plan  ?I told the patient that he will probably need excision of pilonidal cyst in the future as it has recurred.  He would like to do this once he is finished school, which is fine with me.  I told him to call me should he have any further flareups.  He will call back to schedule the surgery.  The risks and benefits of the procedure were fully explained to the patient, who gave informed   consent.

## 2021-07-15 ENCOUNTER — Encounter (HOSPITAL_COMMUNITY)
Admission: RE | Admit: 2021-07-15 | Discharge: 2021-07-15 | Disposition: A | Discharge status: Home / Self Care | Payer: No Typology Code available for payment source | Source: Ambulatory Visit | Attending: General Surgery | Admitting: General Surgery

## 2021-07-20 ENCOUNTER — Encounter (HOSPITAL_COMMUNITY): Payer: Self-pay | Admitting: General Surgery

## 2021-07-20 DIAGNOSIS — L0591 Pilonidal cyst without abscess: Secondary | ICD-10-CM

## 2021-07-20 HISTORY — DX: Pilonidal cyst without abscess: L05.91

## 2021-07-21 ENCOUNTER — Ambulatory Visit (HOSPITAL_COMMUNITY): Payer: No Typology Code available for payment source | Admitting: Anesthesiology

## 2021-07-21 ENCOUNTER — Other Ambulatory Visit: Payer: Self-pay

## 2021-07-21 ENCOUNTER — Ambulatory Visit (HOSPITAL_BASED_OUTPATIENT_CLINIC_OR_DEPARTMENT_OTHER): Payer: No Typology Code available for payment source | Admitting: Anesthesiology

## 2021-07-21 ENCOUNTER — Encounter (HOSPITAL_COMMUNITY): Payer: Self-pay | Admitting: General Surgery

## 2021-07-21 ENCOUNTER — Encounter (HOSPITAL_COMMUNITY)
Admission: RE | Disposition: A | Discharge status: Home / Self Care | Payer: Self-pay | Source: Home / Self Care | Attending: General Surgery

## 2021-07-21 ENCOUNTER — Ambulatory Visit (HOSPITAL_COMMUNITY)
Admission: RE | Admit: 2021-07-21 | Discharge: 2021-07-21 | Disposition: A | Discharge status: Home / Self Care | Payer: No Typology Code available for payment source | Attending: General Surgery | Admitting: General Surgery

## 2021-07-21 DIAGNOSIS — L0591 Pilonidal cyst without abscess: Secondary | ICD-10-CM

## 2021-07-21 HISTORY — PX: PILONIDAL CYST EXCISION: SHX744

## 2021-07-21 SURGERY — EXCISION, SIMPLE PILONIDAL CYST
Anesthesia: General

## 2021-07-21 MED ORDER — PROPOFOL 10 MG/ML IV BOLUS
INTRAVENOUS | Status: DC | PRN
  Administered 2021-07-21: 200 mg via INTRAVENOUS

## 2021-07-21 MED ORDER — LIDOCAINE HCL (CARDIAC) PF 100 MG/5ML IV SOSY
PREFILLED_SYRINGE | INTRAVENOUS | Status: DC | PRN
  Administered 2021-07-21: 100 mg via INTRAVENOUS

## 2021-07-21 MED ORDER — HYDROMORPHONE HCL 1 MG/ML IJ SOLN: 0.2500 mg | INTRAMUSCULAR | Status: DC | PRN

## 2021-07-21 MED ORDER — DEXAMETHASONE SODIUM PHOSPHATE 10 MG/ML IJ SOLN
INTRAMUSCULAR | Status: AC
  Filled 2021-07-21: qty 1

## 2021-07-21 MED ORDER — CHLORHEXIDINE GLUCONATE CLOTH 2 % EX PADS: 6.0000 | MEDICATED_PAD | Freq: Once | CUTANEOUS | Status: DC

## 2021-07-21 MED ORDER — ONDANSETRON HCL 4 MG/2ML IJ SOLN: 4.0000 mg | Freq: Once | INTRAMUSCULAR | Status: DC | PRN

## 2021-07-21 MED ORDER — HYDROCODONE-ACETAMINOPHEN 5-325 MG PO TABS
1.0000 | ORAL_TABLET | Freq: Four times a day (QID) | ORAL | 0 refills | Status: DC | PRN
Start: 2021-07-21 — End: 2021-12-31

## 2021-07-21 MED ORDER — CHLORHEXIDINE GLUCONATE 0.12 % MT SOLN
15.0000 mL | Freq: Once | OROMUCOSAL | Status: AC
  Administered 2021-07-21: 15 mL via OROMUCOSAL

## 2021-07-21 MED ORDER — SODIUM CHLORIDE 0.9 % IR SOLN
Status: DC | PRN
  Administered 2021-07-21: 1000 mL

## 2021-07-21 MED ORDER — FENTANYL CITRATE (PF) 100 MCG/2ML IJ SOLN
INTRAMUSCULAR | Status: AC
  Filled 2021-07-21: qty 2

## 2021-07-21 MED ORDER — ONDANSETRON HCL 4 MG/2ML IJ SOLN
INTRAMUSCULAR | Status: AC
  Filled 2021-07-21: qty 2

## 2021-07-21 MED ORDER — FENTANYL CITRATE (PF) 100 MCG/2ML IJ SOLN
INTRAMUSCULAR | Status: DC | PRN
  Administered 2021-07-21 (×2): 50 ug via INTRAVENOUS

## 2021-07-21 MED ORDER — MEPERIDINE HCL 50 MG/ML IJ SOLN: 6.2500 mg | INTRAMUSCULAR | Status: DC | PRN

## 2021-07-21 MED ORDER — MIDAZOLAM HCL 5 MG/5ML IJ SOLN
INTRAMUSCULAR | Status: DC | PRN
  Administered 2021-07-21: 2 mg via INTRAVENOUS

## 2021-07-21 MED ORDER — LIDOCAINE HCL (PF) 1 % IJ SOLN
INTRAMUSCULAR | Status: DC | PRN
  Administered 2021-07-21: 15 mL

## 2021-07-21 MED ORDER — PHENYLEPHRINE 80 MCG/ML (10ML) SYRINGE FOR IV PUSH (FOR BLOOD PRESSURE SUPPORT)
PREFILLED_SYRINGE | INTRAVENOUS | Status: AC
  Filled 2021-07-21: qty 10

## 2021-07-21 MED ORDER — MIDAZOLAM HCL 2 MG/2ML IJ SOLN
INTRAMUSCULAR | Status: AC
  Filled 2021-07-21: qty 2

## 2021-07-21 MED ORDER — POVIDONE-IODINE 10 % EX OINT
TOPICAL_OINTMENT | CUTANEOUS | Status: AC
  Filled 2021-07-21: qty 1

## 2021-07-21 MED ORDER — LIDOCAINE HCL (PF) 1 % IJ SOLN
INTRAMUSCULAR | Status: AC
  Filled 2021-07-21: qty 30

## 2021-07-21 MED ORDER — PROPOFOL 10 MG/ML IV BOLUS
INTRAVENOUS | Status: AC
  Filled 2021-07-21: qty 20

## 2021-07-21 MED ORDER — CEFAZOLIN SODIUM-DEXTROSE 2-4 GM/100ML-% IV SOLN
2.0000 g | INTRAVENOUS | Status: AC
  Administered 2021-07-21: 2 g via INTRAVENOUS

## 2021-07-21 MED ORDER — LACTATED RINGERS IV SOLN: INTRAVENOUS | Status: DC

## 2021-07-21 MED ORDER — HYDROGEN PEROXIDE 3 % EX SOLN
CUTANEOUS | Status: DC | PRN
  Administered 2021-07-21: 1

## 2021-07-21 MED ORDER — DEXAMETHASONE SODIUM PHOSPHATE 10 MG/ML IJ SOLN
INTRAMUSCULAR | Status: DC | PRN
  Administered 2021-07-21: 10 mg via INTRAVENOUS

## 2021-07-21 MED ORDER — ONDANSETRON HCL 4 MG/2ML IJ SOLN
INTRAMUSCULAR | Status: DC | PRN
  Administered 2021-07-21: 4 mg via INTRAVENOUS

## 2021-07-21 MED ORDER — CEFAZOLIN SODIUM-DEXTROSE 2-4 GM/100ML-% IV SOLN
INTRAVENOUS | Status: AC
  Filled 2021-07-21: qty 100

## 2021-07-21 MED ORDER — ORAL CARE MOUTH RINSE: 15.0000 mL | Freq: Once | OROMUCOSAL | Status: AC

## 2021-07-21 MED ORDER — KETOROLAC TROMETHAMINE 30 MG/ML IJ SOLN
30.0000 mg | Freq: Once | INTRAMUSCULAR | Status: AC
  Administered 2021-07-21: 30 mg via INTRAVENOUS
  Filled 2021-07-21: qty 1

## 2021-07-21 MED ORDER — LIDOCAINE HCL (PF) 2 % IJ SOLN
INTRAMUSCULAR | Status: AC
  Filled 2021-07-21: qty 5

## 2021-07-21 SURGICAL SUPPLY — 35 items
BLADE SURG SZ11 CARB STEEL (BLADE) ×3 IMPLANT
CANNULA VESSEL 3MM 2 BLNT TIP (CANNULA) ×3 IMPLANT
CLOTH BEACON ORANGE TIMEOUT ST (SAFETY) ×3 IMPLANT
COVER LIGHT HANDLE STERIS (MISCELLANEOUS) ×6 IMPLANT
DECANTER SPIKE VIAL GLASS SM (MISCELLANEOUS) ×3 IMPLANT
DRSG TEGADERM 4X10 (GAUZE/BANDAGES/DRESSINGS) ×1 IMPLANT
ELECT REM PT RETURN 9FT ADLT (ELECTROSURGICAL) ×2
ELECTRODE REM PT RTRN 9FT ADLT (ELECTROSURGICAL) ×2 IMPLANT
GAUZE SPONGE 4X4 12PLY STRL (GAUZE/BANDAGES/DRESSINGS) ×6 IMPLANT
GAUZE SPONGE 4X4 12PLY STRL LF (GAUZE/BANDAGES/DRESSINGS) ×3 IMPLANT
GLOVE BIO SURGEON STRL SZ 6.5 (GLOVE) ×1 IMPLANT
GLOVE BIO SURGEON STRL SZ7 (GLOVE) ×1 IMPLANT
GLOVE BIOGEL PI IND STRL 6.5 (GLOVE) IMPLANT
GLOVE BIOGEL PI IND STRL 7.0 (GLOVE) ×4 IMPLANT
GLOVE BIOGEL PI INDICATOR 6.5 (GLOVE) ×1
GLOVE BIOGEL PI INDICATOR 7.0 (GLOVE) ×3
GLOVE SURG SS PI 7.5 STRL IVOR (GLOVE) ×3 IMPLANT
GOWN STRL REUS W/TWL LRG LVL3 (GOWN DISPOSABLE) ×7 IMPLANT
KIT TURNOVER KIT A (KITS) ×3 IMPLANT
MANIFOLD NEPTUNE II (INSTRUMENTS) ×3 IMPLANT
NDL HYPO 25X1 1.5 SAFETY (NEEDLE) ×2 IMPLANT
NEEDLE HYPO 25X1 1.5 SAFETY (NEEDLE) ×2 IMPLANT
NS IRRIG 1000ML POUR BTL (IV SOLUTION) ×3 IMPLANT
PACK MINOR (CUSTOM PROCEDURE TRAY) ×3 IMPLANT
PAD ABD 5X9 TENDERSORB (GAUZE/BANDAGES/DRESSINGS) ×3 IMPLANT
PAD ARMBOARD 7.5X6 YLW CONV (MISCELLANEOUS) ×3 IMPLANT
PENCIL SMOKE EVACUATOR (MISCELLANEOUS) ×3 IMPLANT
SET BASIN LINEN APH (SET/KITS/TRAYS/PACK) ×3 IMPLANT
SPONGE GAUZE 2X2 8PLY STRL LF (GAUZE/BANDAGES/DRESSINGS) ×1 IMPLANT
SPONGE T-LAP 18X18 ~~LOC~~+RFID (SPONGE) ×3 IMPLANT
SUT ETHILON 3 0 PS 1 (SUTURE) IMPLANT
SUT PROLENE 2 0 FS (SUTURE) IMPLANT
SUT PROLENE 3 0 PS 2 (SUTURE) ×1 IMPLANT
SYR 30ML LL (SYRINGE) ×3 IMPLANT
SYR CONTROL 10ML LL (SYRINGE) ×3 IMPLANT

## 2021-07-21 NOTE — Anesthesia Procedure Notes (Addendum)
Procedure Name: LMA Insertion Date/Time: 07/21/2021 7:36 AM  Performed by: Lorin Glass, CRNAPre-anesthesia Checklist: Patient identified, Emergency Drugs available, Suction available and Patient being monitored Patient Re-evaluated:Patient Re-evaluated prior to induction Oxygen Delivery Method: Circle system utilized Preoxygenation: Pre-oxygenation with 100% oxygen Induction Type: IV induction LMA: LMA inserted LMA Size: 5.0 Number of attempts: 1 Placement Confirmation: positive ETCO2 and breath sounds checked- equal and bilateral Tube secured with: Tape Dental Injury: Teeth and Oropharynx as per pre-operative assessment  Comments: Jonathan Ford, [place]

## 2021-07-21 NOTE — Anesthesia Preprocedure Evaluation (Signed)
Anesthesia Evaluation  Patient identified by MRN, date of birth, ID band Patient awake    Reviewed: Allergy & Precautions, NPO status , Patient's Chart, lab work & pertinent test results  Airway Mallampati: II  TM Distance: >3 FB Neck ROM: Full    Dental  (+) Dental Advisory Given, Teeth Intact   Pulmonary neg pulmonary ROS,    Pulmonary exam normal breath sounds clear to auscultation       Cardiovascular negative cardio ROS Normal cardiovascular exam Rhythm:Regular Rate:Normal     Neuro/Psych negative neurological ROS  negative psych ROS   GI/Hepatic negative GI ROS, Neg liver ROS,   Endo/Other  negative endocrine ROS  Renal/GU negative Renal ROS  negative genitourinary   Musculoskeletal negative musculoskeletal ROS (+)   Abdominal   Peds negative pediatric ROS (+)  Hematology negative hematology ROS (+)   Anesthesia Other Findings   Reproductive/Obstetrics negative OB ROS                             Anesthesia Physical Anesthesia Plan  ASA: 1  Anesthesia Plan: General   Post-op Pain Management: Dilaudid IV   Induction: Intravenous  PONV Risk Score and Plan: 3  Airway Management Planned: LMA  Additional Equipment:   Intra-op Plan:   Post-operative Plan: Extubation in OR  Informed Consent: I have reviewed the patients History and Physical, chart, labs and discussed the procedure including the risks, benefits and alternatives for the proposed anesthesia with the patient or authorized representative who has indicated his/her understanding and acceptance.     Dental advisory given  Plan Discussed with: CRNA and Surgeon  Anesthesia Plan Comments:         Anesthesia Quick Evaluation

## 2021-07-21 NOTE — Interval H&P Note (Signed)
History and Physical Interval Note:  07/21/2021 7:16 AM  Jonathan Ford  has presented today for surgery, with the diagnosis of INFECTED PILONIDAL CYST.  The various methods of treatment have been discussed with the patient and family. After consideration of risks, benefits and other options for treatment, the patient has consented to  Procedure(s): CYST EXCISION PILONIDAL SIMPLE (N/A) as a surgical intervention.  The patient's history has been reviewed, patient examined, no change in status, stable for surgery.  I have reviewed the patient's chart and labs.  Questions were answered to the patient's satisfaction.     Franky Macho

## 2021-07-21 NOTE — Op Note (Signed)
Patient:  Jonathan Ford  DOB:  11-03-2000  MRN:  [id number]   Preop Diagnosis: Pilonidal cyst  Postop Diagnosis: Same  Procedure: Excision of pilonidal cyst  Surgeon: Franky Macho, MD  Anes: General  Indications: Patient is a 21 year old white male who presents with recurrent infected pilonidal cyst.  The risks and benefits of the procedure including bleeding, infection, wound breakdown, and the possibility of recurrence of the cyst were fully explained to the patient, who gave informed consent.  Procedure note: The patient was placed in the right lateral decubitus position after general anesthesia was administered.  The area over the coccyx was prepped and draped using the usual sterile technique with Betadine.  Surgical site confirmation was performed.  An elliptical incision was made around the main opening of the pilonidal cyst with 2 other small punctum's present more caudad.  A curette was used to excise the pilonidal cyst.  Hair follicles and granulation tissue were removed.  The wound was irrigated with hydrogen peroxide.  Any bleeding was controlled using Bovie electrocautery.  1% Xylocaine was used for local anesthesia.  The subcutaneous layer was reapproximated using a 3-0 Vicryl interrupted suture.  The skin was closed using 2-0 Prolene interrupted sutures.  Betadine ointment and dry sterile dressing were applied.  All tape and needle counts were correct at the end of the procedure.  The patient was awakened and transferred to PACU in stable condition.  Complications: None  EBL: Minimal  Specimen: Pilonidal cyst

## 2021-07-21 NOTE — Anesthesia Postprocedure Evaluation (Signed)
Anesthesia Post Note  Patient: Jonathan Ford  Procedure(s) Performed: CYST EXCISION PILONIDAL SIMPLE  Patient location during evaluation: Phase II Anesthesia Type: General Level of consciousness: awake and alert and oriented Pain management: pain level controlled Vital Signs Assessment: post-procedure vital signs reviewed and stable Respiratory status: spontaneous breathing, nonlabored ventilation and respiratory function stable Cardiovascular status: blood pressure returned to baseline and stable Postop Assessment: no apparent nausea or vomiting Anesthetic complications: no   No notable events documented.   Last Vitals:  Vitals:   07/21/21 0900 07/21/21 0901  BP: (!) 142/83 130/85  Pulse: (!) 49 (!) 51  Resp: 13 18  Temp:  36.6 C  SpO2: 100% 100%    Last Pain:  Vitals:   07/21/21 0901  TempSrc: Oral  PainSc: 1                  Terri Rorrer C Adan Baehr

## 2021-07-21 NOTE — Transfer of Care (Signed)
Immediate Anesthesia Transfer of Care Note  Patient: Jonathan Ford  Procedure(s) Performed: CYST EXCISION PILONIDAL SIMPLE  Patient Location: PACU  Anesthesia Type:General  Level of Consciousness: drowsy  Airway & Oxygen Therapy: Patient Spontanous Breathing and Patient connected to face mask oxygen  Post-op Assessment: Report given to RN and Post -op Vital signs reviewed and stable  Post vital signs: Reviewed and stable  Last Vitals:  Vitals Value Taken Time  BP 130/49   Temp    Pulse 42 07/21/21 0821  Resp 8 07/21/21 0821  SpO2 100 % 07/21/21 0821  Vitals shown include unvalidated device data.  Last Pain:  Vitals:   07/21/21 0635  TempSrc: Oral         Complications: No notable events documented.

## 2021-07-22 ENCOUNTER — Encounter (HOSPITAL_COMMUNITY): Payer: Self-pay | Admitting: General Surgery

## 2021-07-22 LAB — SURGICAL PATHOLOGY

## 2021-07-22 MED ORDER — POVIDONE-IODINE 10 % OINT PACKET
TOPICAL_OINTMENT | CUTANEOUS | Status: DC | PRN
  Administered 2021-07-21: 1 via TOPICAL

## 2021-07-29 ENCOUNTER — Telehealth: Payer: Self-pay | Admitting: *Deleted

## 2021-07-29 NOTE — Telephone Encounter (Signed)
Received call from patient mother, Jasmine [phone]- [phone] telephone.  Surgical Date: 07/21/2021 Procedure: Excision Pilonidal Cyst  Patient reports that he continues to have clear to pink tinged drainage from excision site. Advised that some drainage is normal and is not of concern.   Given Sx to monitor for infection.   Verbalized understanding.

## 2021-08-04 ENCOUNTER — Ambulatory Visit (INDEPENDENT_AMBULATORY_CARE_PROVIDER_SITE_OTHER): Payer: No Typology Code available for payment source | Admitting: General Surgery

## 2021-08-04 ENCOUNTER — Encounter: Payer: Self-pay | Admitting: General Surgery

## 2021-08-04 VITALS — BP 127/72 | HR 53 | Temp 98.6°F | Resp 12 | Ht 73.0 in | Wt 211.0 lb

## 2021-08-04 DIAGNOSIS — L0591 Pilonidal cyst without abscess: Secondary | ICD-10-CM

## 2021-08-04 NOTE — Progress Notes (Signed)
[facility]  Had pilonidal excision with Dr. Lovell Sheehan on 7/5. Here for suture removal. Says it was draining some.    Blood pressure 127/72, pulse (!) 53, temperature 98.6 F (37 C), temperature source Oral, resp. rate 12, height 6\' 1"  (1.854 m), weight 211 lb (95.7 kg), SpO2 97 %. Sutures pulled through superiorly Opening like pits with hair in them down the midline    Hair removed and irrigated   Patient s/p pilonidal excision with Dr. and looks like wound has opened up some and sutures pulled through. Hair was starting to get into the areas and almost looked like it was reforming a pilonidal.  Keep hair out of area, shave area Flush area with peroxide twice daily.  See Dr. Lovell Sheehan in a few weeks to see how this is healing.  Gave him my pilonidal information for hair removal, recurrence, etc  We discussed that pilonidal cysts/ sinuses are abnormal tissue under the skin that is prone to infections. It is more common in patients that are overweight, family history of a pilonidal cyst, deep gluteal cleft and thicker body hair, especially in the region of the gluteal cleft. We discussed that the exact cause of the disease is unknown but could be related to ingrown hairs/ and inflammation in the area coupled with mechanical / shear forces. We discussed that these cysts and sinuses can be surgically removed but that they often recur.  Prior to surgery, options for management include keeping the area clean by removal of hair from the area to decrease bacterial load and prevent trapping of feces in the area, cleansing the area after bowel movements, and daily to twice daily showering as well as keeping the area dry and wicking away moisture from the area if needed with dry gauze replaced often.  Also discussed pulling hair from the pits as this is one cause of the sinus getting clogged and infected.    We discussed that surgery has a high risk of recurrence (as high as  50%). We discussed that is reserved for symptomatic people who fail medical management. We discussed that the surgery historically has consisted of removing large areas of tissue and that this leaves large wounds to heal or pack. The healing of this area and a wound can take months and the need for a reoperation is possible.  We discussed newer techniques for pilonidal cyst removal including the minimal invasive, "GIPS Procedure," which results in better cosmesis and decreased wound issues.   We discussed the risk of surgery including but not limited to bleeding, infection, open punch biopsy sites that are sometimes flushed with peroxide, possible need for reoperation due to cyst recurrence.    Future Appointments  Date Time Provider Department Center  08/17/2021  2:45 PM 10/17/2021, MD [facility] None   Franky Macho, MD [facility] [address] [place], [place] [phone] (office)

## 2021-08-04 NOTE — Patient Instructions (Addendum)
Flush area with peroxide twice daily.  See Dr. Lovell Sheehan in a few weeks to see how this is healing.   Dr. Henreitta Leber' Information on Pilonidal Disease  Pilonidal Cyst Information   Pilonidal cysts/ sinuses are abnormal tissue under the skin that is prone to infections. It is more common in patients that are overweight, have a family history of a pilonidal cyst, have a deep gluteal cleft (butt crack) and thicker body hair, especially thicker hair in the region of the gluteal cleft. The exact cause of the disease is unknown but it could be related to ingrown hairs and inflammation in the area of the gluteal cleft coupled with mechanical and shear forces (butt crack rubbing together).  These cyst and sinuses are often associated with pits (small openings) that have hair coming out of them.  These cysts and sinuses can be surgically removed but they often recur and the recurrence can be has high as 50% of the the time.    Prior to surgery, options for management include keeping the area clean.  You should remove hair from the gluteal cleft area to decrease the bacterial load and prevent trapping of feces in the area as well as cleaning the area after bowel movements (showering/ washing off), and performing daily to twice daily showering. Removing the hair from the pits on a regular basis with tweezers can also prevent the sinus from getting clogged and infected.  In addition, keeping the area dry and wicking away moisture from the area if needed with dry gauze that is replaced often is also an option.    Any surgical procedure for the pilonidal cyst/ sinus has a high risk of recurrence (as high as 50%). Surgery is reserved for symptomatic people who fail medical management (above regimen). Surgery historically has consisted of removing large areas of tissue from the gluteal cleft region, which creates large wounds to heal or pack. The healing of a wound in this area can take months and the need for a reoperation  is possible.  Newer techniques for pilonidal cyst removal including the minimally invasive, "GIPS Procedure," which results in better cosmesis and decreased wound issues.  You would have to flush the biopsy sites with peroxide after this surgery. You can google GIPS procedure, and see Youtube videos.   The risk of any surgery for a pilonidal cyst includes but is not limited to bleeding, infection, re-operation, and recurrence of the cyst.

## 2021-08-17 ENCOUNTER — Ambulatory Visit (INDEPENDENT_AMBULATORY_CARE_PROVIDER_SITE_OTHER): Payer: No Typology Code available for payment source | Admitting: General Surgery

## 2021-08-17 ENCOUNTER — Encounter: Payer: Self-pay | Admitting: General Surgery

## 2021-08-17 VITALS — BP 128/72 | HR 59 | Temp 98.0°F | Resp 12 | Ht 73.0 in | Wt 214.0 lb

## 2021-08-17 DIAGNOSIS — Z09 Encounter for follow-up examination after completed treatment for conditions other than malignant neoplasm: Secondary | ICD-10-CM

## 2021-08-17 NOTE — Progress Notes (Signed)
Subjective:     Jonathan Ford  Here for postop wound check, status post excision of pilonidal cyst.  Patient states he is not having any issues.  He has no drainage from the wound.  He denies any fever or chills. Objective:    BP 128/72   Pulse (!) 59   Temp 98 F (36.7 C) (Oral)   Resp 12   Ht 6\' 1"  (1.854 m)   Wt 214 lb (97.1 kg)   SpO2 96%   BMI 28.23 kg/m   General:  alert, cooperative, and no distress  Pilonidal incision well-healed.  1 small area of indentation that is 1 mm in diameter is present superiorly.  It is not open.     Assessment:    Doing well postoperatively.    Plan:   Follow-up here as needed.  May resume normal activities.

## 2021-12-31 ENCOUNTER — Ambulatory Visit (INDEPENDENT_AMBULATORY_CARE_PROVIDER_SITE_OTHER): Payer: No Typology Code available for payment source | Admitting: Family Medicine

## 2021-12-31 ENCOUNTER — Encounter: Payer: Self-pay | Admitting: Family Medicine

## 2021-12-31 VITALS — BP 132/81 | HR 50 | Temp 97.2°F | Ht 73.0 in | Wt 203.8 lb

## 2021-12-31 DIAGNOSIS — Z8379 Family history of other diseases of the digestive system: Secondary | ICD-10-CM | POA: Diagnosis not present

## 2021-12-31 DIAGNOSIS — R110 Nausea: Secondary | ICD-10-CM

## 2021-12-31 DIAGNOSIS — R634 Abnormal weight loss: Secondary | ICD-10-CM | POA: Diagnosis not present

## 2021-12-31 DIAGNOSIS — R6881 Early satiety: Secondary | ICD-10-CM

## 2021-12-31 DIAGNOSIS — Z720 Tobacco use: Secondary | ICD-10-CM

## 2021-12-31 LAB — BAYER DCA HB A1C WAIVED: HB A1C (BAYER DCA - WAIVED): 5.3 % (ref 4.8–5.6)

## 2021-12-31 NOTE — Progress Notes (Signed)
Subjective:  Patient ID: Jonathan Ford, male    DOB: February 06, 2000, 21 y.o.   MRN: [id number]  Patient Care Team: Janora Norlander, DO as PCP - General (Family Medicine)   Chief Complaint:  Weight Loss (Patient states in the last month he has been having weight loss and loss of appetite. X 1 month. )   HPI: Jonathan Ford is a 21 y.o. male presenting on 12/31/2021 for Weight Loss (Patient states in the last month he has been having weight loss and loss of appetite. X 1 month. )   Pt presents today with complaints of unexplained weight loss, nausea, and early satiety. This started about 1 month ago, has lost about 15 lbs to date. He states when he eats he gets nauseated or feels full very quickly. Denies changes in bowel or bladder. No associated rashes. Denies water brash, epigastric pain, belching, or vomiting. Does use nicotine dip pouches daily. Has not tried anything for symptoms. Strong family history of thyroid disease and celiac disease.    Relevant past medical, surgical, family, and social history reviewed and updated as indicated.  Allergies and medications reviewed and updated. Data reviewed: Chart in Epic.   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    Past Surgical History:  Procedure Laterality Date   PILONIDAL CYST EXCISION N/A 07/21/2021   Procedure: CYST EXCISION PILONIDAL SIMPLE;  Surgeon: Aviva Signs, MD;  Location: [facility];  Service: General;  Laterality: N/A;    Social History   Socioeconomic History   Marital status: Single    Spouse name: n/a   Number of children: 0   Years of education: Not on file   Highest education level: Not on file  Occupational History   Occupation: student    Comment: [employer]  Tobacco Use   Smoking status: Never   Smokeless tobacco: Never  Vaping Use   Vaping Use: Never used  Substance and Sexual Activity   Alcohol use: Yes    Comment: rare   Drug use: No    Sexual activity: Yes    Birth control/protection: Condom  Other Topics Concern   Not on file  Social History Narrative   Lives with both parents and his sister in the same household.   Social Determinants of Health   Financial Resource Strain: Not on file  Food Insecurity: Not on file  Transportation Needs: Not on file  Physical Activity: Not on file  Stress: Not on file  Social Connections: Not on file  Intimate Partner Violence: Not on file    Outpatient Encounter Medications as of 12/31/2021  Medication Sig   ibuprofen (ADVIL) 200 MG tablet Take 400 mg by mouth every 8 (eight) hours as needed (for pain.).   [DISCONTINUED] Creatine POWD Take 1 Scoop by mouth daily. (Patient not taking: Reported on 12/31/2021)   [DISCONTINUED] HYDROcodone-acetaminophen (NORCO) 5-325 MG tablet Take 1 tablet by mouth every 6 (six) hours as needed for moderate pain. (Patient not taking: Reported on 08/04/2021)   Facility-Administered Encounter Medications as of 12/31/2021  Medication   povidone-iodine (BETADINE) 10 % ointment    No Known Allergies  Review of Systems  Constitutional:  Positive for appetite change and unexpected weight change. Negative for activity change, chills, diaphoresis, fatigue and fever.  HENT: Negative.    Eyes: Negative.  Negative for photophobia and visual disturbance.  Respiratory:  Negative for cough, chest tightness and shortness of breath.   Cardiovascular:  Negative for chest pain, palpitations and leg swelling.  Gastrointestinal:  Positive for nausea. Negative for abdominal distention, abdominal pain, anal bleeding, blood in stool, constipation, diarrhea, rectal pain and vomiting.       Early satiety  Endocrine: Negative.  Negative for cold intolerance, polydipsia, polyphagia and polyuria.  Genitourinary:  Negative for decreased urine volume, difficulty urinating, dysuria, frequency and urgency.  Musculoskeletal:  Negative for arthralgias and myalgias.  Skin:  Negative.   Allergic/Immunologic: Negative.   Neurological:  Negative for dizziness, tremors, seizures, syncope, facial asymmetry, speech difficulty, weakness, light-headedness, numbness and headaches.  Hematological: Negative.   Psychiatric/Behavioral:  Negative for confusion, hallucinations, sleep disturbance and suicidal ideas.   All other systems reviewed and are negative.       Objective:  BP 132/81   Pulse (!) 50   Temp (!) 97.2 F (36.2 C) (Temporal)   Ht _0  (1.854 m)   Wt 203 lb 12.8 oz (92.4 kg)   SpO2 100%   BMI 26.89 kg/m    Wt Readings from Last 3 Encounters:  12/31/21 203 lb 12.8 oz (92.4 kg)  08/17/21 214 lb (97.1 kg)  08/04/21 211 lb (95.7 kg)    Physical Exam Vitals and nursing note reviewed.  Constitutional:      General: He is not in acute distress.    Appearance: Normal appearance. He is well-developed and well-groomed. He is not ill-appearing, toxic-appearing or diaphoretic.  HENT:     Head: Normocephalic and atraumatic.     Jaw: There is normal jaw occlusion.     Right Ear: Hearing normal.     Left Ear: Hearing normal.     Nose: Nose normal.     Mouth/Throat:     Lips: Pink.     Mouth: Mucous membranes are moist.     Pharynx: Oropharynx is clear. Uvula midline.  Eyes:     General: Lids are normal.     Extraocular Movements: Extraocular movements intact.     Conjunctiva/sclera: Conjunctivae normal.     Pupils: Pupils are equal, round, and reactive to light.  Neck:     Thyroid: No thyroid mass, thyromegaly or thyroid tenderness.     Vascular: No carotid bruit or JVD.     Trachea: Trachea and phonation normal.  Cardiovascular:     Rate and Rhythm: Normal rate and regular rhythm.     Chest Wall: PMI is not displaced.     Pulses: Normal pulses.     Heart sounds: Normal heart sounds. No murmur heard.    No friction rub. No gallop.  Pulmonary:     Effort: Pulmonary effort is normal. No respiratory distress.     Breath sounds: Normal breath  sounds. No wheezing.  Abdominal:     General: Bowel sounds are normal. There is no distension or abdominal bruit.     Palpations: Abdomen is soft. There is no hepatomegaly, splenomegaly or mass.     Tenderness: There is no abdominal tenderness. There is no right CVA tenderness, left CVA tenderness, guarding or rebound.     Hernia: No hernia is present.  Musculoskeletal:        General: Normal range of motion.     Cervical back: Normal range of motion and neck supple.     Right lower leg: No edema.     Left lower leg: No edema.  Lymphadenopathy:     Cervical: No cervical adenopathy.  Skin:    General: Skin is warm and dry.  Capillary Refill: Capillary refill takes less than 2 seconds.     Coloration: Skin is not cyanotic, jaundiced or pale.     Findings: No rash.  Neurological:     General: No focal deficit present.     Mental Status: He is alert and oriented to person, place, and time.     Sensory: Sensation is intact.     Motor: Motor function is intact.     Coordination: Coordination is intact.     Gait: Gait is intact.     Deep Tendon Reflexes: Reflexes are normal and symmetric.  Psychiatric:        Attention and Perception: Attention and perception normal.        Mood and Affect: Mood and affect normal.        Speech: Speech normal.        Behavior: Behavior normal. Behavior is cooperative.        Thought Content: Thought content normal.        Cognition and Memory: Cognition and memory normal.        Judgment: Judgment normal.     Results for orders placed or performed during the hospital encounter of 07/21/21  Surgical pathology  Result Value Ref Range   SURGICAL PATHOLOGY      SURGICAL PATHOLOGY CASE: [id number] PATIENT: Jonathan Ford Surgical Pathology Report     Clinical History: infected pilonidal cyst     FINAL MICROSCOPIC DIAGNOSIS:  A. 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  The largest piece has possible pink-red smooth to granular umbilicated skin on 1 surface. Cut surfaces are unremarkable, with no intact cystic structures identified. The larger piece is trisected, smaller piece submitted whole, and the entire specimen is submitted in 1 block.  SW 07/21/2021   Final Diagnosis performed by Tobin Chad, MD.   Electronically signed 07/22/2021 Technical component performed at [facility], [place] [address], [place], [place] [address].  Professional component performed at [employer]. [facility], [address], [place], [place] [address].  Immunohistochemistry Technical component (if applicable) was performed at [facility]. [address] [place], [place], [place] [address].   IMMUNOHISTOCHEMISTRY DISCLAIMER (if applicable): Some of these immunohistochemical stains may have been developed and the performance characteristics determine by [facility]. Some may not have been cleared or approved by the U.S. Food and Drug Administration. The FDA has determined that such clearance or approval is not necessary. This test is used for clinical purposes. It should not be regarded as investigational or for research. This laboratory is certified under the [place] (CLIA-88) as qualified to perform high complexity clinical laboratory testing.  The controls stained appropriately.        Pertinent labs & imaging results that were available during my care of the patient were reviewed by me and considered in my medical decision making.  Assessment & Plan:  Andrej was seen today for weight loss.  Diagnoses and all orders for this visit:  Weight loss, non-intentional Nausea  Early satiety Family history of celiac disease Nicotine  use Will check for potential underlying causes of weight loss. Advised this could be reflux as he does use nicotine on a regular basis, will trial over the counter Pepcid  for 6 weeks. Further treatment pending lab results. Aware of red flags which require repeat evaluation. Follow up in 6-8 weeks, sooner if warranted.  -     CBC with Differential/Platelet -     CMP14+EGFR -     Thyroid Panel With TSH -     Bayer DCA Hb A1c Waived -     Celiac Disease Panel     Continue all other maintenance medications.  Follow up plan: Return in about 6 weeks (around 02/11/2022), or if symptoms worsen or fail to improve.   Continue healthy lifestyle choices, including diet (rich in fruits, vegetables, and lean proteins, and low in salt and simple carbohydrates) and exercise (at least 30 minutes of moderate physical activity daily).   The above assessment and management plan was discussed with the patient. The patient verbalized understanding of and has agreed to the management plan. Patient is aware to call the clinic if they develop any new symptoms or if symptoms persist or worsen. Patient is aware when to return to the clinic for a follow-up visit. Patient educated on when it is appropriate to go to the emergency department.   Monia Pouch, FNP-C [facility] [phone]

## 2022-01-04 LAB — CMP14+EGFR
ALT: 11 IU/L (ref 0–44)
AST: 11 IU/L (ref 0–40)
Albumin/Globulin Ratio: 2 (ref 1.2–2.2)
Albumin: 4.3 g/dL (ref 4.3–5.2)
Alkaline Phosphatase: 120 IU/L (ref 44–121)
BUN/Creatinine Ratio: 7 — ABNORMAL LOW (ref 9–20)
BUN: 8 mg/dL (ref 6–20)
Bilirubin Total: 0.2 mg/dL (ref 0.0–1.2)
CO2: 22 mmol/L (ref 20–29)
Calcium: 9.5 mg/dL (ref 8.7–10.2)
Chloride: 102 mmol/L (ref 96–106)
Creatinine, Ser: 1.11 mg/dL (ref 0.76–1.27)
Globulin, Total: 2.1 g/dL (ref 1.5–4.5)
Glucose: 86 mg/dL (ref 70–99)
Potassium: 4.1 mmol/L (ref 3.5–5.2)
Sodium: 143 mmol/L (ref 134–144)
Total Protein: 6.4 g/dL (ref 6.0–8.5)
eGFR: 97 mL/min/{1.73_m2} (ref >59)

## 2022-01-04 LAB — CBC WITH DIFFERENTIAL/PLATELET
Basophils Absolute: 0.1 10*3/uL (ref 0.0–0.2)
Basos: 1 %
EOS (ABSOLUTE): 0 10*3/uL (ref 0.0–0.4)
Eos: 1 %
Hematocrit: 46.6 % (ref 37.5–51.0)
Hemoglobin: 16.3 g/dL (ref 13.0–17.7)
Immature Grans (Abs): 0 10*3/uL (ref 0.0–0.1)
Immature Granulocytes: 0 %
Lymphocytes Absolute: 2.2 10*3/uL (ref 0.7–3.1)
Lymphs: 46 %
MCH: 29.1 pg (ref 26.6–33.0)
MCHC: 35 g/dL (ref 31.5–35.7)
MCV: 83 fL (ref 79–97)
Monocytes Absolute: 0.3 10*3/uL (ref 0.1–0.9)
Monocytes: 6 %
Neutrophils Absolute: 2.2 10*3/uL (ref 1.4–7.0)
Neutrophils: 46 %
Platelets: 249 10*3/uL (ref 150–450)
RBC: 5.6 x10E6/uL (ref 4.14–5.80)
RDW: 14.2 % (ref 11.6–15.4)
WBC: 4.7 10*3/uL (ref 3.4–10.8)

## 2022-01-04 LAB — THYROID PANEL WITH TSH
Free Thyroxine Index: 2.4 (ref 1.2–4.9)
T3 Uptake Ratio: 31 % (ref 24–39)
T4, Total: 7.7 ug/dL (ref 4.5–12.0)
TSH: 2.4 u[IU]/mL (ref 0.450–4.500)

## 2022-01-04 LAB — CELIAC DISEASE PANEL
Endomysial IgA: NEGATIVE
IgA/Immunoglobulin A, Serum: 226 mg/dL (ref 90–386)
Transglutaminase IgA: 3 U/mL (ref 0–3)

## 2022-07-28 DIAGNOSIS — B078 Other viral warts: Secondary | ICD-10-CM | POA: Diagnosis not present

## 2022-07-28 DIAGNOSIS — D225 Melanocytic nevi of trunk: Secondary | ICD-10-CM | POA: Diagnosis not present

## 2022-07-28 DIAGNOSIS — D224 Melanocytic nevi of scalp and neck: Secondary | ICD-10-CM | POA: Diagnosis not present

## 2022-07-28 DIAGNOSIS — L814 Other melanin hyperpigmentation: Secondary | ICD-10-CM | POA: Diagnosis not present

## 2022-07-28 DIAGNOSIS — D2262 Melanocytic nevi of left upper limb, including shoulder: Secondary | ICD-10-CM | POA: Diagnosis not present

## 2022-08-30 ENCOUNTER — Ambulatory Visit: Payer: 59 | Admitting: Family Medicine

## 2022-08-30 ENCOUNTER — Encounter: Payer: Self-pay | Admitting: Family Medicine

## 2022-08-30 VITALS — BP 120/76 | HR 53 | Temp 96.8°F | Resp 20 | Ht 73.0 in | Wt 210.4 lb

## 2022-08-30 DIAGNOSIS — M5412 Radiculopathy, cervical region: Secondary | ICD-10-CM

## 2022-08-30 MED ORDER — PREDNISONE 20 MG PO TABS: 40.0000 mg | ORAL_TABLET | Freq: Every day | ORAL | 0 refills | Status: AC

## 2022-08-30 NOTE — Progress Notes (Signed)
Subjective:  Patient ID: Jonathan Ford, male    DOB: 15-Jun-2000, 22 y.o.   MRN: [id number]  Patient Care Team: Raliegh Ip, DO as PCP - General (Family Medicine)   Chief Complaint:  Shoulder Pain   HPI: Jonathan Ford is a 22 y.o. male presenting on 08/30/2022 for Shoulder Pain   Pt presents today with complaints of left shoulder pain, shooting down arm with certain movements and when he turns his head. States he was lifting heavy weights on Thursday. States shoulder was fine after lifting, states he woke up and had the pain the next day. No loss of function, no loss of grip strength, no loss of ROM. States the pain is burning and shooting in nature. Worse when he turns his head a certain way or when he moves a certain way. Has been taking Motrin and using BioFreeze without relief of symptoms.   Shoulder Pain  The pain is present in the left shoulder. There has been no history of extremity trauma. The problem occurs intermittently. The problem has been waxing and waning. The quality of the pain is described as burning and sharp. Associated symptoms include tingling. Pertinent negatives include no fever, inability to bear weight, itching, joint locking, joint swelling, limited range of motion, numbness or stiffness. He has tried NSAIDS and OTC ointments for the symptoms. The treatment provided mild relief.       Relevant past medical, surgical, family, and social history reviewed and updated as indicated.  Allergies and medications reviewed and updated. Data reviewed: Chart in Epic.   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    Past Surgical History:  Procedure Laterality Date   PILONIDAL CYST EXCISION N/A 07/21/2021   Procedure: CYST EXCISION PILONIDAL SIMPLE;  Surgeon: Franky Macho, MD;  Location: [facility];  Service: General;  Laterality: N/A;    Social History   Socioeconomic History   Marital status: Single    Spouse name: n/a    Number of children: 0   Years of education: Not on file   Highest education level: Not on file  Occupational History   Occupation: student    Comment: [employer]  Tobacco Use   Smoking status: Never   Smokeless tobacco: Never  Vaping Use   Vaping status: Never Used  Substance and Sexual Activity   Alcohol use: Yes    Comment: rare   Drug use: No   Sexual activity: Yes    Birth control/protection: Condom  Other Topics Concern   Not on file  Social History Narrative   Lives with both parents and his sister in the same household.   Social Determinants of Health   Financial Resource Strain: Not on file  Food Insecurity: Not on file  Transportation Needs: Not on file  Physical Activity: Not on file  Stress: Not on file  Social Connections: Not on file  Intimate Partner Violence: Not on file    Outpatient Encounter Medications as of 08/30/2022  Medication Sig   predniSONE (DELTASONE) 20 MG tablet Take 2 tablets (40 mg total) by mouth daily with breakfast for 5 days.   ibuprofen (ADVIL) 200 MG tablet Take 400 mg by mouth every 8 (eight) hours as needed (for pain.).   Facility-Administered Encounter Medications as of 08/30/2022  Medication   povidone-iodine (BETADINE) 10 % ointment    No Known Allergies  Review of Systems  Constitutional:  Negative for activity change, appetite change, chills, diaphoresis,  fatigue, fever and unexpected weight change.  HENT: Negative.    Eyes: Negative.   Respiratory:  Negative for apnea, cough, choking, chest tightness, wheezing and stridor.   Cardiovascular:  Negative for chest pain, palpitations and leg swelling.  Gastrointestinal:  Negative for blood in stool, constipation, diarrhea, nausea and vomiting.  Endocrine: Negative.   Genitourinary:  Negative for dysuria, frequency and urgency.  Musculoskeletal:  Positive for arthralgias and myalgias. Negative for back pain, gait problem, joint swelling, neck pain, neck  stiffness and stiffness.  Skin: Negative.  Negative for itching.  Allergic/Immunologic: Negative.   Neurological:  Positive for tingling. Negative for dizziness, tremors, seizures, syncope, facial asymmetry, speech difficulty, weakness, light-headedness, numbness and headaches.  Hematological: Negative.   Psychiatric/Behavioral:  Negative for confusion, hallucinations, sleep disturbance and suicidal ideas.   All other systems reviewed and are negative.       Objective:  BP 120/76   Pulse (!) 53   Temp (!) 96.8 F (36 C) (Oral)   Resp 20   Ht 6\' 1"  (1.854 m)   Wt 210 lb 6 oz (95.4 kg)   SpO2 100%   BMI 27.76 kg/m    Wt Readings from Last 3 Encounters:  08/30/22 210 lb 6 oz (95.4 kg)  12/31/21 203 lb 12.8 oz (92.4 kg)  08/17/21 214 lb (97.1 kg)    Physical Exam Vitals and nursing note reviewed.  Constitutional:      General: He is not in acute distress.    Appearance: Normal appearance. He is well-developed and well-groomed. He is not ill-appearing, toxic-appearing or diaphoretic.  HENT:     Head: Normocephalic and atraumatic.     Jaw: There is normal jaw occlusion.     Right Ear: Hearing normal.     Left Ear: Hearing normal.     Nose: Nose normal.     Mouth/Throat:     Lips: Pink.     Mouth: Mucous membranes are moist.     Pharynx: Oropharynx is clear. Uvula midline.  Eyes:     General: Lids are normal.     Extraocular Movements: Extraocular movements intact.     Conjunctiva/sclera: Conjunctivae normal.     Pupils: Pupils are equal, round, and reactive to light.  Neck:     Thyroid: No thyroid mass, thyromegaly or thyroid tenderness.     Vascular: No carotid bruit or JVD.     Trachea: Trachea and phonation normal.  Cardiovascular:     Rate and Rhythm: Normal rate and regular rhythm.     Chest Wall: PMI is not displaced.     Pulses: Normal pulses.     Heart sounds: Normal heart sounds. No murmur heard.    No friction rub. No gallop.  Pulmonary:     Effort:  Pulmonary effort is normal. No respiratory distress.     Breath sounds: Normal breath sounds. No wheezing.  Abdominal:     General: Bowel sounds are normal. There is no distension or abdominal bruit.     Palpations: Abdomen is soft. There is no hepatomegaly or splenomegaly.     Tenderness: There is no abdominal tenderness. There is no right CVA tenderness or left CVA tenderness.     Hernia: No hernia is present.  Musculoskeletal:        General: Normal range of motion.     Right shoulder: Normal.     Left shoulder: Normal.     Right upper arm: Normal.     Left upper arm: Normal.  Cervical back: Normal, normal range of motion and neck supple.     Right lower leg: No edema.     Left lower leg: No edema.  Lymphadenopathy:     Cervical: No cervical adenopathy.  Skin:    General: Skin is warm and dry.     Capillary Refill: Capillary refill takes less than 2 seconds.     Coloration: Skin is not cyanotic, jaundiced or pale.     Findings: No rash.  Neurological:     General: No focal deficit present.     Mental Status: He is alert and oriented to person, place, and time.     Sensory: Sensation is intact.     Motor: Motor function is intact.     Coordination: Coordination is intact.     Gait: Gait is intact.     Deep Tendon Reflexes: Reflexes are normal and symmetric.  Psychiatric:        Attention and Perception: Attention and perception normal.        Mood and Affect: Mood and affect normal.        Speech: Speech normal.        Behavior: Behavior normal. Behavior is cooperative.        Thought Content: Thought content normal.        Cognition and Memory: Cognition and memory normal.        Judgment: Judgment normal.     Results for orders placed or performed in visit on 12/31/21  CBC with Differential/Platelet  Result Value Ref Range   WBC 4.7 3.4 - 10.8 x10E3/uL   RBC 5.60 4.14 - 5.80 x10E6/uL   Hemoglobin 16.3 13.0 - 17.7 g/dL   Hematocrit 41.3 24.4 - 51.0 %   MCV 83 79  - 97 fL   MCH 29.1 26.6 - 33.0 pg   MCHC 35.0 31.5 - 35.7 g/dL   RDW 01.0 27.2 - 53.6 %   Platelets 249 150 - 450 x10E3/uL   Neutrophils 46 Not Estab. %   Lymphs 46 Not Estab. %   Monocytes 6 Not Estab. %   Eos 1 Not Estab. %   Basos 1 Not Estab. %   Neutrophils Absolute 2.2 1.4 - 7.0 x10E3/uL   Lymphocytes Absolute 2.2 0.7 - 3.1 x10E3/uL   Monocytes Absolute 0.3 0.1 - 0.9 x10E3/uL   EOS (ABSOLUTE) 0.0 0.0 - 0.4 x10E3/uL   Basophils Absolute 0.1 0.0 - 0.2 x10E3/uL   Immature Granulocytes 0 Not Estab. %   Immature Grans (Abs) 0.0 0.0 - 0.1 x10E3/uL  CMP14+EGFR  Result Value Ref Range   Glucose 86 70 - 99 mg/dL   BUN 8 6 - 20 mg/dL   Creatinine, Ser 6.44 0.76 - 1.27 mg/dL   eGFR 97 >03 KV/QQV/9.56   BUN/Creatinine Ratio 7 (L) 9 - 20   Sodium 143 134 - 144 mmol/L   Potassium 4.1 3.5 - 5.2 mmol/L   Chloride 102 96 - 106 mmol/L   CO2 22 20 - 29 mmol/L   Calcium 9.5 8.7 - 10.2 mg/dL   Total Protein 6.4 6.0 - 8.5 g/dL   Albumin 4.3 4.3 - 5.2 g/dL   Globulin, Total 2.1 1.5 - 4.5 g/dL   Albumin/Globulin Ratio 2.0 1.2 - 2.2   Bilirubin Total 0.2 0.0 - 1.2 mg/dL   Alkaline Phosphatase 120 44 - 121 IU/L   AST 11 0 - 40 IU/L   ALT 11 0 - 44 IU/L  Thyroid Panel With TSH  Result Value Ref  Range   TSH 2.400 0.450 - 4.500 uIU/mL   T4, Total 7.7 4.5 - 12.0 ug/dL   T3 Uptake Ratio 31 24 - 39 %   Free Thyroxine Index 2.4 1.2 - 4.9  Bayer DCA Hb A1c Waived  Result Value Ref Range   HB A1C (BAYER DCA - WAIVED) 5.3 4.8 - 5.6 %  Celiac Disease Panel  Result Value Ref Range   Endomysial IgA Negative Negative   Transglutaminase IgA 3 0 - 3 U/mL   IgA/Immunoglobulin A, Serum 226 90 - 386 mg/dL       Pertinent labs & imaging results that were available during my care of the patient were reviewed by me and considered in my medical decision making.  Assessment & Plan:  Nero was seen today for shoulder pain.  Diagnoses and all orders for this visit:  Cervical radiculopathy No red  flags present. Radicular pain, will burst with steroids. Aware if this is not beneficial, will obtain imaging. Aware to avoid heavy lifting for the next few weeks. Discussed patho of radicular pain in detail with patient.  -     predniSONE (DELTASONE) 20 MG tablet; Take 2 tablets (40 mg total) by mouth daily with breakfast for 5 days.     Continue all other maintenance medications.  Follow up plan: Return if symptoms worsen or fail to improve.   Continue healthy lifestyle choices, including diet (rich in fruits, vegetables, and lean proteins, and low in salt and simple carbohydrates) and exercise (at least 30 minutes of moderate physical activity daily).  Educational handout given for cervical radiculopathy  The above assessment and management plan was discussed with the patient. The patient verbalized understanding of and has agreed to the management plan. Patient is aware to call the clinic if they develop any new symptoms or if symptoms persist or worsen. Patient is aware when to return to the clinic for a follow-up visit. Patient educated on when it is appropriate to go to the emergency department.   Kari Baars, FNP-C [facility] [phone]

## 2023-01-07 ENCOUNTER — Ambulatory Visit
Admission: RE | Admit: 2023-01-07 | Discharge: 2023-01-07 | Disposition: A | Discharge status: Home / Self Care | Payer: Self-pay | Source: Ambulatory Visit | Attending: Internal Medicine | Admitting: Internal Medicine

## 2023-01-07 VITALS — BP 112/73 | HR 53 | Temp 98.3°F | Resp 20

## 2023-01-07 DIAGNOSIS — L255 Unspecified contact dermatitis due to plants, except food: Secondary | ICD-10-CM

## 2023-01-07 MED ORDER — HYDROXYZINE HCL 25 MG PO TABS: 12.5000 mg | ORAL_TABLET | Freq: Three times a day (TID) | ORAL | 0 refills | Status: DC | PRN

## 2023-01-07 MED ORDER — PREDNISONE 20 MG PO TABS: ORAL_TABLET | ORAL | 0 refills | Status: DC

## 2023-01-07 NOTE — ED Triage Notes (Signed)
Pt c/o rash to face, neck, groin area started 12/19-using benadryl cream-NAD-steady gait

## 2023-01-07 NOTE — ED Provider Notes (Signed)
[facility]  Note:  This document was prepared using Conservation officer, historic buildings and may include unintentional dictation errors.  MRN: [id number] DOB: 12/25/2000  Subjective:   Jonathan Ford is a 22 y.o. male presenting for 2 to 3-day history of acute onset persistent and worsening itchy rash over the face, neck, arms.  Symptoms started after he was duck hunting and ended up going into brush in the woods consistently.  No difficulty with his breathing, oral swelling, throat closing sensation.  No current facility-administered medications for this encounter.  Current Outpatient Medications:    ibuprofen (ADVIL) 200 MG tablet, Take 400 mg by mouth every 8 (eight) hours as needed (for pain.)., Disp: , Rfl:   Facility-Administered Medications Ordered in Other Encounters:    povidone-iodine (BETADINE) 10 % ointment, , , PRN, Franky Macho, MD, 1 Application at 07/21/21 0803   No Known Allergies  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     Past Surgical History:  Procedure Laterality Date   PILONIDAL CYST EXCISION N/A 07/21/2021   Procedure: CYST EXCISION PILONIDAL SIMPLE;  Surgeon: Franky Macho, MD;  Location: [facility];  Service: General;  Laterality: N/A;    Family History  Problem Relation Age of Onset   Arthritis Maternal Grandfather    Hyperlipidemia Paternal Grandmother    Hypertension Paternal Grandmother    Stroke Paternal Grandmother    Hypertension Paternal Grandfather    Healthy Mother    Healthy Father     Social History   Tobacco Use   Smoking status: Never   Smokeless tobacco: Current  Vaping Use   Vaping status: Never Used  Substance Use Topics   Alcohol use: Yes    Comment: weekly   Drug use: No    ROS   Objective:   Vitals: BP 112/73 (BP Location: Right Arm)   Pulse (!) 53   Temp 98.3 F (36.8 C) (Oral)   Resp 20   SpO2 98%   Physical Exam Constitutional:      General: He  is not in acute distress.    Appearance: Normal appearance. He is well-developed and normal weight. He is not ill-appearing, toxic-appearing or diaphoretic.  HENT:     Head: Normocephalic and atraumatic.     Right Ear: External ear normal.     Left Ear: External ear normal.     Nose: Nose normal.     Mouth/Throat:     Mouth: Mucous membranes are moist.     Pharynx: No oropharyngeal exudate or posterior oropharyngeal erythema.     Comments: Airway is patent, no oropharyngeal swelling or stridor. Eyes:     General: No scleral icterus.       Right eye: No discharge.        Left eye: No discharge.     Extraocular Movements: Extraocular movements intact.  Cardiovascular:     Rate and Rhythm: Normal rate and regular rhythm.     Heart sounds: Normal heart sounds. No murmur heard.    No friction rub. No gallop.  Pulmonary:     Effort: Pulmonary effort is normal. No respiratory distress.     Breath sounds: Normal breath sounds. No stridor. No wheezing, rhonchi or rales.  Musculoskeletal:     Cervical back: Normal range of motion.  Skin:    Findings: Rash (macular pruritic irregular shaped patches over the left side of his neck, left side of his face with trace eye lid swelling; has  similar lesions to a lesser degree over the wrist bilaterally) present.  Neurological:     Mental Status: He is alert and oriented to person, place, and time.  Psychiatric:        Mood and Affect: Mood normal.        Behavior: Behavior normal.        Thought Content: Thought content normal.        Judgment: Judgment normal.    Assessment and Plan :   PDMP not reviewed this encounter.  1. Rhus dermatitis    Recommended an oral prednisone course for 2 weeks.  Use hydroxyzine as needed for itching.  Low suspicion for anaphylaxis.  Counseled patient on potential for adverse effects with medications prescribed/recommended today, ER and return-to-clinic precautions discussed, patient verbalized  understanding.    Wallis Bamberg, [place] 01/07/23 1138

## 2023-01-20 ENCOUNTER — Ambulatory Visit: Payer: 59 | Admitting: Nurse Practitioner

## 2023-01-20 ENCOUNTER — Ambulatory Visit (INDEPENDENT_AMBULATORY_CARE_PROVIDER_SITE_OTHER): Payer: 59

## 2023-01-20 ENCOUNTER — Encounter: Payer: Self-pay | Admitting: Nurse Practitioner

## 2023-01-20 VITALS — BP 136/80 | HR 80 | Temp 97.8°F | Resp 20 | Ht 73.0 in | Wt 226.0 lb

## 2023-01-20 DIAGNOSIS — R109 Unspecified abdominal pain: Secondary | ICD-10-CM

## 2023-01-20 DIAGNOSIS — M546 Pain in thoracic spine: Secondary | ICD-10-CM | POA: Diagnosis not present

## 2023-01-20 DIAGNOSIS — K5901 Slow transit constipation: Secondary | ICD-10-CM

## 2023-01-20 LAB — URINALYSIS, COMPLETE
Bilirubin, UA: NEGATIVE
Glucose, UA: NEGATIVE
Ketones, UA: NEGATIVE
Leukocytes,UA: NEGATIVE
Nitrite, UA: NEGATIVE
Protein,UA: NEGATIVE
RBC, UA: NEGATIVE
Specific Gravity, UA: 1.015 (ref 1.005–1.030)
Urobilinogen, Ur: 0.2 mg/dL (ref 0.2–1.0)
pH, UA: 6 (ref 5.0–7.5)

## 2023-01-20 MED ORDER — PREDNISONE 20 MG PO TABS: 40.0000 mg | ORAL_TABLET | Freq: Every day | ORAL | 0 refills | Status: AC

## 2023-01-20 MED ORDER — CYCLOBENZAPRINE HCL 10 MG PO TABS: 10.0000 mg | ORAL_TABLET | Freq: Three times a day (TID) | ORAL | 1 refills | Status: DC | PRN

## 2023-01-20 NOTE — Patient Instructions (Signed)

## 2023-01-20 NOTE — Progress Notes (Signed)
   Subjective:    Patient ID: Jonathan Ford, male    DOB: 10/07/2000, 23 y.o.   MRN: [id number]   Chief Complaint: Back Pain (Low back on left side/)   Back Pain This is a new problem. The current episode started yesterday. The problem occurs intermittently. The problem has been gradually improving since onset. The pain is present in the thoracic spine. The quality of the pain is described as aching. The pain does not radiate. The pain is Worse during the day. The symptoms are aggravated by bending and twisting. Pertinent negatives include no dysuria, fever, paresthesias or pelvic pain. He has tried nothing for the symptoms.    Patient Active Problem List   Diagnosis Date Noted   History of concussion 12/24/2015       Review of Systems  Constitutional:  Negative for chills and fever.  Genitourinary:  Negative for dysuria and pelvic pain.  Musculoskeletal:  Positive for back pain.  Neurological:  Negative for paresthesias.       Objective:   Physical Exam Constitutional:      Appearance: Normal appearance.  Cardiovascular:     Rate and Rhythm: Normal rate and regular rhythm.     Heart sounds: Normal heart sounds.  Pulmonary:     Effort: Pulmonary effort is normal.     Breath sounds: Normal breath sounds.  Skin:    General: Skin is warm.  Neurological:     General: No focal deficit present.     Mental Status: He is alert and oriented to person, place, and time.  Psychiatric:        Mood and Affect: Mood normal.        Behavior: Behavior normal.    BP 136/80   Pulse 80   Temp 97.8 F (36.6 C) (Temporal)   Resp 20   Ht 6' 1 (1.854 m)   Wt 226 lb (102.5 kg)   SpO2 97%   BMI 29.82 kg/m   KUB- moderate stool burden-Preliminary reading by Ronal Lunger, FNP  [facility]       Assessment & Plan:   Leonce Kiss in today with chief complaint of Back Pain (Low back on left side/)   1. Side pain (Primary) - Urinalysis, Complete - Urine Culture - DG Abd 1 View  2.  Acute left-sided thoracic back pain Moist heat rest - predniSONE  (DELTASONE ) 20 MG tablet; Take 2 tablets (40 mg total) by mouth daily with breakfast for 5 days. 2 po daily for 5 days  Dispense: 10 tablet; Refill: 0 - cyclobenzaprine  (FLEXERIL ) 10 MG tablet; Take 1 tablet (10 mg total) by mouth 3 (three) times daily as needed for muscle spasms.  Dispense: 30 tablet; Refill: 1  3. Slow transit constipation Miralax daily RTO prn    The above assessment and management plan was discussed with the patient. The patient verbalized understanding of and has agreed to the management plan. Patient is aware to call the clinic if symptoms persist or worsen. Patient is aware when to return to the clinic for a follow-up visit. Patient educated on when it is appropriate to go to the emergency department.   Mary-Margaret Lunger, FNP

## 2023-01-22 LAB — URINE CULTURE

## 2023-04-26 ENCOUNTER — Ambulatory Visit: Admitting: Family Medicine

## 2023-04-26 ENCOUNTER — Encounter: Payer: Self-pay | Admitting: Family Medicine

## 2023-04-26 VITALS — BP 134/79 | HR 59 | Temp 97.9°F | Ht 73.0 in | Wt 230.0 lb

## 2023-04-26 DIAGNOSIS — L0591 Pilonidal cyst without abscess: Secondary | ICD-10-CM | POA: Diagnosis not present

## 2023-04-26 NOTE — Progress Notes (Signed)
 Chief Complaint  Patient presents with   Pilonidal cyst    Hd surgery and it was fine for 2 years. Started draining two days ago. Blood in drainage. A little sore.    HPI  Patient presents today for history of pilonidal cyst surgery proximately 2 years ago.  It has not caused him any problems until 3 days ago he had some pink bloody drainage from the area of the cyst.  It was drained for a short time and then stopped and has not started since then.  He is concerned about possible infection he has his no pain this time and no swelling.  He points to the superior area of the gluteal crease he denies any blood coming from anal opening or surrounding such as with hemorrhoids  PMH: Smoking status noted   Objective: BP 134/79   Pulse (!) 59   Temp 97.9 F (36.6 C)   Ht 6\' 1"  (1.854 m)   Wt 230 lb (104.3 kg)   SpO2 97%   BMI 30.34 kg/m  Gen: NAD, alert, cooperative with exam Skin: The anal verge and superiorly through the gluteal crease was inspected carefully with no sign of lesion being appreciated.  There was a 4 mm area of slight hyperemia in the central portion of the crease that looked quite benign and was not open. Neuro: Alert and oriented, No gross deficits  Pilonidal cyst   Patient was reassured there was no sign of any problem currently.  He was advised to return or notify the office should the bleeding or other drainage occur.  Also should there be increased pain and swelling he should report those.

## 2023-07-28 ENCOUNTER — Ambulatory Visit: Admitting: Family Medicine

## 2023-07-28 ENCOUNTER — Encounter: Payer: Self-pay | Admitting: Family Medicine

## 2023-07-28 VITALS — BP 120/77 | HR 51 | Temp 97.5°F | Ht 73.0 in | Wt 225.4 lb

## 2023-07-28 DIAGNOSIS — B079 Viral wart, unspecified: Secondary | ICD-10-CM

## 2023-07-28 NOTE — Progress Notes (Signed)
 Subjective:  Patient ID: Jonathan Ford, male    DOB: 2000-04-11, 23 y.o.   MRN: [id number]  Patient Care Team: Jolinda Norene HERO, DO as PCP - General (Family Medicine)   Chief Complaint:  Warts (Wart on right knee. Had frozen off several times and has grown back bigger. )   HPI: Jonathan Ford is a 23 y.o. male presenting on 07/28/2023 for Warts (Wart on right knee. Had frozen off several times and has grown back bigger. )   The patient presents with a recurrent skin lesion on their hand.  The skin lesion on their hand initially appeared approximately five years ago and was treated with cryotherapy, resulting in temporary resolution. A smaller lesion subsequently developed adjacent to the original site and was also treated with cryotherapy. However, a ring-like formation has now appeared around the previous lesions.  They had a dermatology consultation last year, where the lesion was noted but not deemed concerning at that time. The lesion has since started to grow again.  They recall a previous successful cryotherapy treatment for a similar lesion on their elbow, which resolved completely.        Relevant past medical, surgical, family, and social history reviewed and updated as indicated.  Allergies and medications reviewed and updated. Data reviewed: Chart in Epic.   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    Past Surgical History:  Procedure Laterality Date   PILONIDAL CYST EXCISION N/A 07/21/2021   Procedure: CYST EXCISION PILONIDAL SIMPLE;  Surgeon: Mavis Anes, MD;  Location: [facility];  Service: General;  Laterality: N/A;    Social History   Socioeconomic History   Marital status: Single    Spouse name: n/a   Number of children: 0   Years of education: Not on file   Highest education level: Not on file  Occupational History   Occupation: student    Comment: [employer]house [employer]  Tobacco Use   Smoking status:  Never   Smokeless tobacco: Current  Vaping Use   Vaping status: Never Used  Substance and Sexual Activity   Alcohol use: Yes    Comment: weekly   Drug use: No   Sexual activity: Yes    Birth control/protection: Condom  Other Topics Concern   Not on file  Social History Narrative   Lives with both parents and his sister in the same household.   Social Drivers of Corporate investment banker Strain: Not on file  Food Insecurity: Not on file  Transportation Needs: Not on file  Physical Activity: Not on file  Stress: Not on file  Social Connections: Not on file  Intimate Partner Violence: Not on file    Outpatient Encounter Medications as of 07/28/2023  Medication Sig   ibuprofen (ADVIL) 200 MG tablet Take 400 mg by mouth every 8 (eight) hours as needed (for pain.).   cyclobenzaprine  (FLEXERIL ) 10 MG tablet Take 1 tablet (10 mg total) by mouth 3 (three) times daily as needed for muscle spasms. (Patient not taking: Reported on 07/28/2023)   Facility-Administered Encounter Medications as of 07/28/2023  Medication   povidone-iodine  (BETADINE ) 10 % ointment    No Known Allergies  Pertinent ROS per HPI, otherwise unremarkable      Objective:  BP 120/77   Pulse (!) 51   Temp (!) 97.5 F (36.4 C)   Ht 6' 1 (1.854 m)   Wt 225 lb 6.4 oz (102.2 kg)   SpO2 96%  BMI 29.74 kg/m    Wt Readings from Last 3 Encounters:  07/28/23 225 lb 6.4 oz (102.2 kg)  04/26/23 230 lb (104.3 kg)  01/20/23 226 lb (102.5 kg)    Physical Exam Vitals and nursing note reviewed.  Constitutional:      General: He is not in acute distress.    Appearance: Normal appearance. He is not ill-appearing, toxic-appearing or diaphoretic.  HENT:     Head: Normocephalic and atraumatic.     Mouth/Throat:     Mouth: Mucous membranes are moist.  Eyes:     Conjunctiva/sclera: Conjunctivae normal.     Pupils: Pupils are equal, round, and reactive to light.  Cardiovascular:     Rate and Rhythm: Normal rate  and regular rhythm.     Heart sounds: Normal heart sounds.  Pulmonary:     Effort: Pulmonary effort is normal.     Breath sounds: Normal breath sounds.  Musculoskeletal:     Right lower leg: No edema.     Left lower leg: No edema.  Skin:    General: Skin is warm and dry.     Capillary Refill: Capillary refill takes less than 2 seconds.     Findings: Lesion present.      Neurological:     General: No focal deficit present.     Mental Status: He is alert and oriented to person, place, and time.  Psychiatric:        Mood and Affect: Mood normal.        Behavior: Behavior normal.        Thought Content: Thought content normal.        Judgment: Judgment normal.    Skin excision  Date/Time: 07/28/2023 12:36 PM  Performed by: Severa Rock HERO, FNP Authorized by: Severa Rock HERO, FNP   Number of Lesions: 1 Lesion 1:    Body area: lower extremity   Lower extremity location: R knee   Initial size (mm): 5   Malignancy: malignancy unknown     Destruction method: cryotherapy     Destruction method comment: three freeze thaw cycles    Results for orders placed or performed in visit on 01/20/23  Urine Culture   Collection Time: 01/20/23  2:24 PM   Specimen: Urine   UR  Result Value Ref Range   Urine Culture, Routine Final report    Organism ID, Bacteria Comment   Urinalysis, Complete   Collection Time: 01/20/23  2:24 PM  Result Value Ref Range   Specific Gravity, UA 1.015 1.005 - 1.030   pH, UA 6.0 5.0 - 7.5   Color, UA Yellow Yellow   Appearance Ur Clear Clear   Leukocytes,UA Negative Negative   Protein,UA Negative Negative/Trace   Glucose, UA Negative Negative   Ketones, UA Negative Negative   RBC, UA Negative Negative   Bilirubin, UA Negative Negative   Urobilinogen, Ur 0.2 0.2 - 1.0 mg/dL   Nitrite, UA Negative Negative   Microscopic Examination CANCELED        Pertinent labs & imaging results that were available during my care of the patient were reviewed by me  and considered in my medical decision making.  Assessment & Plan:  Tobenna was seen today for warts.  Diagnoses and all orders for this visit:  Verruca vulgaris of lower extremity -     Skin excision     Cutaneous wart Recurrent cutaneous wart initially treated with cryotherapy five years ago, resolved temporarily but recurred as a smaller lesion  adjacent to the original site. Despite previous cryotherapy, the wart has persisted and expanded into a ring-like formation. Previous dermatology consultation deemed the lesion non-concerning. Current presentation suggests a resistant wart. - Initiate cryotherapy today. - Schedule follow-up cryotherapy sessions every 3-4 weeks. - Refer to dermatology for further evaluation if the wart does not respond to cryotherapy after a few treatments.          Continue all other maintenance medications.  Follow up plan: Return if symptoms worsen or fail to improve.   Continue healthy lifestyle choices, including diet (rich in fruits, vegetables, and lean proteins, and low in salt and simple carbohydrates) and exercise (at least 30 minutes of moderate physical activity daily).  Educational handout given for cryotherapy aftercare  The above assessment and management plan was discussed with the patient. The patient verbalized understanding of and has agreed to the management plan. Patient is aware to call the clinic if they develop any new symptoms or if symptoms persist or worsen. Patient is aware when to return to the clinic for a follow-up visit. Patient educated on when it is appropriate to go to the emergency department.   Rosaline Bruns, FNP-C [facility] [phone]

## 2023-08-25 ENCOUNTER — Encounter: Payer: Self-pay | Admitting: Family Medicine

## 2023-08-25 ENCOUNTER — Ambulatory Visit: Admitting: Family Medicine

## 2023-08-25 VITALS — BP 128/77 | HR 52 | Temp 97.3°F | Ht 73.0 in | Wt 229.0 lb

## 2023-08-25 DIAGNOSIS — B079 Viral wart, unspecified: Secondary | ICD-10-CM | POA: Diagnosis not present

## 2023-08-25 NOTE — Progress Notes (Deleted)
   Subjective:    Patient ID: Jonathan Ford, male    DOB: [birth year], 23 y.o.   MRN: [id number]  HPI    Review of Systems     Objective:   Physical Exam        Assessment & Plan:

## 2023-08-25 NOTE — Progress Notes (Signed)
   Subjective: CC: Verruca vulgaris PCP: Jolinda Norene HERO, DO YEP:Gjrxdnw Jonathan Ford is a 23 y.o. male presenting to clinic today for:  1.  Wart Patient was seen on 07/28/2023 for a wart on the right knee.  He was treated x 3 with cryoablation.  He returns today for interval treatment.  He reports it does seem to be getting smaller.  He healed well afterwards and had no issues   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    Current Outpatient Medications:    ibuprofen (ADVIL) 200 MG tablet, Take 400 mg by mouth every 8 (eight) hours as needed (for pain.)., Disp: , Rfl:  Social History   Socioeconomic History   Marital status: Single    Spouse name: n/a   Number of children: 0   Years of education: Not on file   Highest education level: Not on file  Occupational History   Occupation: student    Comment: Warehouse manager [employer]  Tobacco Use   Smoking status: Never   Smokeless tobacco: Current  Vaping Use   Vaping status: Never Used  Substance and Sexual Activity   Alcohol use: Yes    Comment: weekly   Drug use: No   Sexual activity: Yes    Birth control/protection: Condom  Other Topics Concern   Not on file  Social History Narrative   Lives with both parents and his sister in the same household.   Social Drivers of Corporate investment banker Strain: Not on file  Food Insecurity: Not on file  Transportation Needs: Not on file  Physical Activity: Not on file  Stress: Not on file  Social Connections: Not on file  Intimate Partner Violence: Not on file   Family History  Problem Relation Age of Onset   Arthritis Maternal Grandfather    Hyperlipidemia Paternal Grandmother    Hypertension Paternal Grandmother    Stroke Paternal Grandmother    Hypertension Paternal Grandfather    Healthy Mother    Healthy Father     Objective: Office vital signs reviewed. BP 128/77   Pulse (!) 52    Temp (!) 97.3 F (36.3 C)   Ht 6' 1 (1.854 m)   Wt 229 lb (103.9 kg)   SpO2 99%   BMI 30.21 kg/m   Physical Examination:  General: Awake, alert, well nourished, No acute distress Skin: Irregularly shaped coliform lesion with central clearing noted along the right anterolateral knee.  Cryotherapy Procedure:  Risks and benefits of procedure were reviewed with the patient.  Written consent obtained and scanned into the chart.  Lesion of concern was identified and located on right knee.  Liquid nitrogen was applied to area of concern and extending out 1 millimeters beyond the border of the lesion.  Treated area was allowed to come back to room temperature before treating it 2 more times.  Patient tolerated procedure well and there were no immediate complications.  Home care instructions were reviewed with the patient and a handout was provided.  Assessment/ Plan: 23 y.o. male   Verruca vulgaris of lower extremity  Treated with cryoablation, session #2.  Will try 1 more session after this in 4 to 6 weeks and if no resolution, plan for referral to dermatology.   Norene HERO Jolinda, DO [facility] [phone]

## 2023-08-25 NOTE — Progress Notes (Deleted)
 Patient is in office today for a nurse visit for {NurseVisitChoices:30435}. Patient {NurseVisitDetails:30447}

## 2023-08-25 NOTE — Patient Instructions (Signed)
Cryoablation, Care After The following information offers guidance on how to care for yourself after your procedure. Your health care provider may also give you more specific instructions. If you have problems or questions, contact your health care provider. What can I expect after the procedure? After the procedure, it is common to have: Redness or blisters near the area treated. Mild pain and swelling. Follow these instructions at home: Treatment area care  If you have an incision, follow instructions from your health care provider about how to take care of it. Make sure you: Wash your hands with soap and water for at least 20 seconds before and after you change your bandage (dressing). If soap and water are not available, use hand sanitizer. Change your dressing as told by your health care provider. Leave stitches (sutures), skin glue, or adhesive strips in place. These skin closures may need to stay in place for 2 weeks or longer. If adhesive strip edges start to loosen and curl up, you may trim the loose edges. Do not remove adhesive strips completely unless your health care provider tells you to do that. Check your treatment area every day for signs of infection. Check for: More redness, swelling, or pain. Fluid or blood. Warmth. Pus or a bad smell. Keep the treated area clean and dry. Keep it covered with a dressing until it has healed. Clean the area with soap and water as told by your health care provider. If your dressing gets wet, change it right away. Activity  Follow instructions from your health care provider about what activities are safe for you. You may have to avoid lifting. Ask your health care provider how much you can safely lift. If you were given a sedative during the procedure, it can affect you for several hours. Do not drive or operate machinery until your health care provider says that it is safe. General instructions Take over-the-counter and prescription  medicines only as told by your health care provider. Do not use any products that contain nicotine or tobacco. These products include cigarettes, chewing tobacco, and vaping devices, such as e-cigarettes. These can delay incision healing. If you need help quitting, ask your health care provider. Do not take baths, swim, or use a hot tub until your health care provider approves. Ask your health care provider if you may take showers. You may only be allowed to take sponge baths. Keep all follow-up visits. Your health care provider may need to check that treatment worked and that there were no problems caused by the procedure. Contact a health care provider if: You have more pain. You have a fever. You have nausea or vomiting. You have any signs of infection. You do not have a bowel movement for 2 days. You cannot urinate, or you cannot control when you urinate or have a bowel movement (have incontinence). You develop impotence. Get help right away if: You have severe pain. You have trouble swallowing or breathing. You are very weak or dizzy. You have chest pain or shortness of breath. These symptoms may be an emergency. Get help right away. Call 911. Do not wait to see if the symptoms will go away. Do not drive yourself to the hospital. This information is not intended to replace advice given to you by your health care provider. Make sure you discuss any questions you have with your health care provider. Document Revised: 06/18/2021 Document Reviewed: 06/18/2021 Elsevier Patient Education  2024 Elsevier Inc.  

## 2023-10-09 ENCOUNTER — Ambulatory Visit: Admitting: Family Medicine

## 2023-10-11 ENCOUNTER — Encounter: Payer: Self-pay | Admitting: Family Medicine

## 2023-11-14 ENCOUNTER — Encounter: Payer: Self-pay | Admitting: General Surgery

## 2023-11-14 ENCOUNTER — Ambulatory Visit: Admitting: General Surgery

## 2023-11-14 VITALS — BP 133/71 | HR 55 | Temp 97.6°F | Resp 12 | Ht 73.0 in | Wt 223.0 lb

## 2023-11-14 DIAGNOSIS — L0591 Pilonidal cyst without abscess: Secondary | ICD-10-CM | POA: Diagnosis not present

## 2023-11-14 NOTE — Progress Notes (Signed)
 Jonathan Ford; [id number]; 2000/12/14   HPI Patient is a 23 year old white male status post excision of pilonidal cyst in the past who presents with episodes of increased swelling and drainage from the pilonidal cyst.  This seems to occur monthly.  He will wake up and noticed some serous or serosanguineous fluid emanating from an opening in the pilonidal cyst region.  He has not been given any antibiotics.  It does seem to resolve on its own.  His last episode was several weeks ago. 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    Past Surgical History:  Procedure Laterality Date   PILONIDAL CYST EXCISION N/A 07/21/2021   Procedure: CYST EXCISION PILONIDAL SIMPLE;  Surgeon: Mavis Anes, MD;  Location: [facility];  Service: General;  Laterality: N/A;    Family History  Problem Relation Age of Onset   Arthritis Maternal Grandfather    Hyperlipidemia Paternal Grandmother    Hypertension Paternal Grandmother    Stroke Paternal Grandmother    Hypertension Paternal Grandfather    Healthy Mother    Healthy Father     Current Outpatient Medications on File Prior to Visit  Medication Sig Dispense Refill   ibuprofen (ADVIL) 200 MG tablet Take 400 mg by mouth every 8 (eight) hours as needed (for pain.).     No current facility-administered medications on file prior to visit.    No Known Allergies  Social History   Substance and Sexual Activity  Alcohol Use Yes   Comment: weekly    Social History   Tobacco Use  Smoking Status Never  Smokeless Tobacco Current    Review of Systems  Constitutional: Negative.   HENT: Negative.    Eyes: Negative.   Respiratory: Negative.    Cardiovascular: Negative.   Gastrointestinal: Negative.   Genitourinary: Negative.   Musculoskeletal: Negative.   Skin: Negative.   Neurological: Negative.   Endo/Heme/Allergies: Negative.   Psychiatric/Behavioral: Negative.      Objective   Vitals:   11/14/23 1447  BP:  133/71  Pulse: (!) 55  Resp: 12  Temp: 97.6 F (36.4 C)  SpO2: 98%    Physical Exam Vitals reviewed.  Constitutional:      Appearance: Normal appearance. He is normal weight. He is not ill-appearing.  HENT:     Head: Normocephalic and atraumatic.  Cardiovascular:     Rate and Rhythm: Normal rate and regular rhythm.     Heart sounds: Normal heart sounds. No murmur heard.    No friction rub. No gallop.  Pulmonary:     Effort: Pulmonary effort is normal. No respiratory distress.     Breath sounds: Normal breath sounds. No stridor. No wheezing, rhonchi or rales.  Skin:    General: Skin is warm and dry.     Comments: Healed surgical scars present over the pilonidal region with a punctate superficial wound that is healed over.  There is no erythema or induration present.  I could not express any fluid.  Neurological:     Mental Status: He is alert and oriented to person, place, and time.     Assessment  Recurrent episodes of drainage from the pilonidal cyst region.  This may be granulation tissue in the subcutaneous tissue as no purulence is present. Plan  At this point, there is nothing to do surgically.  I told him should he have another episode of drainage, he should immediately call my office so I can see him and address the wound.  He  understands and agrees.  Will follow up expectantly.

## 2023-11-22 ENCOUNTER — Ambulatory Visit: Payer: Self-pay | Admitting: Family Medicine

## 2023-11-22 ENCOUNTER — Encounter: Payer: Self-pay | Admitting: Family Medicine

## 2023-11-22 VITALS — BP 130/75 | HR 49 | Temp 97.8°F | Ht 73.0 in | Wt 221.4 lb

## 2023-11-22 DIAGNOSIS — B078 Other viral warts: Secondary | ICD-10-CM | POA: Diagnosis not present

## 2023-11-22 NOTE — Progress Notes (Signed)
 Subjective: CC: wart PCP: Jolinda Norene HERO, DO YEP:Gjrxdnw Jonathan Ford is a 23 y.o. male presenting to clinic today for:  Here for repeat wart treatment on right anterior knee.  Has a new lesion along the right medial knee/ thigh now that he would also like treated.    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   Social Drivers of Corporate Investment Banker Strain: Not on file  Food Insecurity: Not on file  Transportation Needs: Not on file  Physical Activity: Not on file  Stress: Not on file  Social Connections: Not on file  Intimate Partner Violence: Not on file   Family History  Problem Relation Age of Onset   Arthritis Maternal Grandfather    Hyperlipidemia Paternal Grandmother    Hypertension Paternal Grandmother    Stroke Paternal Grandmother    Hypertension Paternal Grandfather    Healthy Mother    Healthy Father     Objective: Office vital signs reviewed. BP 130/75   Pulse (!) 49   Temp 97.8 F (36.6 C)   Ht 6' 1 (1.854 m)   Wt 221 lb 6 oz (100.4 kg)   SpO2 97%   BMI 29.21 kg/m    Physical Examination:  General: Awake, alert, well nourished, No acute distress Skin: Right anterior lateral knee with postinflammatory hypopigmentation centrally at previous wart site.  He has 1 small lesion at the edge of this scar suggestive of persistent viral wart.  He has about a 2 mm lesion along the medial knee/lower thigh that is new on the right  Cryotherapy Procedure:  Risks and benefits of procedure were reviewed with the patient.  Written consent obtained and scanned into the chart.  Lesion of concern was identified and located on right anteriolateral knee and right medial knee.  Liquid nitrogen was applied to area of concern and extending out  1 millimeters beyond the border of the lesion.  Treated area was allowed to come back to room temperature before treating it a second time.  Patient tolerated procedure well and there were no immediate complications.  Home care instructions were reviewed with the patient and a handout was provided.   Assessment/ Plan: 23 y.o. male   Other viral warts   2 warts treated today.  I think this should totally resolve the lesions as it looks much better than when we started and appears to have mostly cleared at least on the anterior aspect.   Norene HERO Jolinda,  DO [facility] [phone]

## 2023-12-20 ENCOUNTER — Encounter: Payer: Self-pay | Admitting: Family Medicine

## 2023-12-20 ENCOUNTER — Ambulatory Visit

## 2023-12-20 VITALS — BP 130/76 | HR 54 | Temp 96.9°F | Ht 73.0 in | Wt 223.0 lb

## 2023-12-20 DIAGNOSIS — B370 Candidal stomatitis: Secondary | ICD-10-CM

## 2023-12-20 MED ORDER — NYSTATIN 100000 UNIT/ML MT SUSP: 5.0000 mL | Freq: Four times a day (QID) | OROMUCOSAL | 0 refills | Status: DC

## 2023-12-20 NOTE — Progress Notes (Signed)
 Subjective:  Patient ID: Jonathan Ford, male    DOB: [birth year], 23 y.o.   MRN: [id number]  Patient Care Team: Jolinda Norene HERO, DO as PCP - General (Family Medicine)   Chief Complaint:  elyce has film on it (Patient states that he noticed it a few days ago.  When he eats his tongue will burn )   HPI: Jonathan Ford is a 23 y.o. male presenting on 12/20/2023 for tounge has film on it (Patient states that he noticed it a few days ago.  When he eats his tongue will burn )   Jonathan Ford is a 23 year old male who presents with symptoms suggestive of oral thrush.  He has been experiencing a sensation of a film on his tongue, similar to previous episodes of thrush, accompanied by a burning sensation on the tongue but not in the throat. These symptoms have been present for a couple of days.  He denies recent antibiotic use but mentions feeling like he had a mild cold recently. In the past, he has used oral medication for similar symptoms, which was effective. He is unsure if he previously used nystatin swish and swallow or pills for treatment.  He has attempted to brush the film off his tongue without success.          Relevant past medical, surgical, family, and social history reviewed and updated as indicated.  Allergies and medications reviewed and updated. Data reviewed: Chart in Epic.   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    Past Surgical History:  Procedure Laterality Date   PILONIDAL CYST EXCISION N/A 07/21/2021   Procedure: CYST EXCISION PILONIDAL SIMPLE;  Surgeon: Mavis Anes, MD;  Location: [facility];  Service: General;  Laterality: N/A;    Social History   Socioeconomic History   Marital status: Single    Spouse name: n/a   Number of children: 0   Years of education: Not on file   Highest education level: Not on file  Occupational History   Occupation: student    Comment: [employer]  Tobacco Use    Smoking status: Never   Smokeless tobacco: Current  Vaping Use   Vaping status: Never Used  Substance and Sexual Activity   Alcohol use: Yes    Comment: weekly   Drug use: No   Sexual activity: Yes    Birth control/protection: Condom  Other Topics Concern   Not on file  Social History Narrative   Lives with both parents and his sister in the same household.   Social Drivers of Corporate Investment Banker Strain: Not on file  Food Insecurity: Not on file  Transportation Needs: Not on file  Physical Activity: Not on file  Stress: Not on file  Social Connections: Not on file  Intimate Partner Violence: Not on file    Outpatient Encounter Medications as of 12/20/2023  Medication Sig   magic mouthwash (nystatin, lidocaine , diphenhydrAMINE, alum & mag hydroxide) suspension Swish and swallow 5 mLs 4 (four) times daily.   ibuprofen (ADVIL) 200 MG tablet Take 400 mg by mouth every 8 (eight) hours as needed (for pain.).   No facility-administered encounter medications on file as of 12/20/2023.    No Known Allergies  Pertinent ROS per HPI, otherwise unremarkable      Objective:  BP 130/76   Pulse (!) 54   Temp (!) 96.9 F (36.1 C)   Ht 6' 1 (1.854 m)  Wt 223 lb (101.2 kg)   SpO2 100%   BMI 29.42 kg/m    Wt Readings from Last 3 Encounters:  12/20/23 223 lb (101.2 kg)  11/22/23 221 lb 6 oz (100.4 kg)  11/14/23 223 lb (101.2 kg)    Physical Exam Vitals and nursing note reviewed.  Constitutional:      General: He is not in acute distress.    Appearance: Normal appearance. He is normal weight. He is not ill-appearing, toxic-appearing or diaphoretic.  HENT:     Head: Normocephalic and atraumatic.     Nose: Nose normal.     Mouth/Throat:     Lips: Pink.     Mouth: Mucous membranes are moist.     Comments: White patches/plaques to tongue Eyes:     Pupils: Pupils are equal, round, and reactive to light.  Cardiovascular:     Rate and Rhythm: Normal rate and  regular rhythm.     Heart sounds: Normal heart sounds.  Pulmonary:     Effort: Pulmonary effort is normal.     Breath sounds: Normal breath sounds.  Musculoskeletal:     Cervical back: Neck supple.     Right lower leg: No edema.  Skin:    General: Skin is warm and dry.     Capillary Refill: Capillary refill takes less than 2 seconds.  Neurological:     General: No focal deficit present.     Mental Status: He is alert and oriented to person, place, and time.  Psychiatric:        Mood and Affect: Mood normal.        Behavior: Behavior normal.        Thought Content: Thought content normal.        Judgment: Judgment normal.      Results for orders placed or performed in visit on 01/20/23  Urine Culture   Collection Time: 01/20/23  2:24 PM   Specimen: Urine   UR  Result Value Ref Range   Urine Culture, Routine Final report    Organism ID, Bacteria Comment   Urinalysis, Complete   Collection Time: 01/20/23  2:24 PM  Result Value Ref Range   Specific Gravity, UA 1.015 1.005 - 1.030   pH, UA 6.0 5.0 - 7.5   Color, UA Yellow Yellow   Appearance Ur Clear Clear   Leukocytes,UA Negative Negative   Protein,UA Negative Negative/Trace   Glucose, UA Negative Negative   Ketones, UA Negative Negative   RBC, UA Negative Negative   Bilirubin, UA Negative Negative   Urobilinogen, Ur 0.2 0.2 - 1.0 mg/dL   Nitrite, UA Negative Negative   Microscopic Examination CANCELED        Pertinent labs & imaging results that were available during my care of the patient were reviewed by me and considered in my medical decision making.  Assessment & Plan:  Aveer was seen today for tounge has film on it.  Diagnoses and all orders for this visit:  Thrush -     magic mouthwash (nystatin , lidocaine , diphenhydrAMINE, alum & mag hydroxide) suspension; Swish and swallow 5 mLs 4 (four) times daily.     Candidal stomatitis Suspected candidal stomatitis with symptoms of a film on the tongue and  burning sensation for a couple of days. No recent antibiotic use or significant illness, though he reports feeling slightly cold. Previous episodes responded to oral antifungal treatment. No throat involvement. Brushing the tongue has been ineffective. - Prescribed nystatin  swish and swallow for treatment. -  Instructed to change toothbrush after two days of treatment and again after completing the treatment. - Sent prescription to pharmacy. - Advised to contact if symptoms do not improve after a couple of days of treatment.          Continue all other maintenance medications.  Follow up plan: Return if symptoms worsen or fail to improve.   Continue healthy lifestyle choices, including diet (rich in fruits, vegetables, and lean proteins, and low in salt and simple carbohydrates) and exercise (at least 30 minutes of moderate physical activity daily).  Educational handout given for thrush   The above assessment and management plan was discussed with the patient. The patient verbalized understanding of and has agreed to the management plan. Patient is aware to call the clinic if they develop any new symptoms or if symptoms persist or worsen. Patient is aware when to return to the clinic for a follow-up visit. Patient educated on when it is appropriate to go to the emergency department.   Rosaline Bruns, FNP-C [facility] [phone]

## 2023-12-27 ENCOUNTER — Ambulatory Visit: Admitting: Family Medicine

## 2023-12-27 ENCOUNTER — Encounter: Payer: Self-pay | Admitting: Family Medicine

## 2023-12-27 VITALS — BP 125/68 | HR 58 | Temp 98.6°F | Ht 73.0 in | Wt 227.0 lb

## 2023-12-27 DIAGNOSIS — R072 Precordial pain: Secondary | ICD-10-CM | POA: Diagnosis not present

## 2023-12-27 NOTE — Progress Notes (Signed)
 Subjective: CC:BP PCP: Jolinda Jonathan HERO, DO YEP:Jonathan Ford is a 23 y.o. male presenting to clinic today for:  Patient reports that he has had some intermittent sharp pain that located just below his left breast that only lasts maybe a couple of minutes maximum before resolving.  Denies any associated diaphoresis, nausea, vomiting, shortness of breath, change in exercise tolerance.  He apparently had a blood pressure that looked elevated recently at a home gathering with systolics above 150 so this caused concern.  His fiance, who is a nurse has done manual blood pressure at home since that time and typically he is having systolics 130s.  He just wanted to make sure everything was okay.  He denies any nausea, change in appetite, bowel habits, heartburn symptoms.  No family history of early CAD.   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    Current Outpatient Medications:    ibuprofen (ADVIL) 200 MG tablet, Take 400 mg by mouth every 8 (eight) hours as needed (for pain.)., Disp: , Rfl:    magic mouthwash (nystatin , lidocaine , diphenhydrAMINE, alum & mag hydroxide) suspension, Swish and swallow 5 mLs 4 (four) times daily., Disp: 180 mL, Rfl: 0 Social History   Socioeconomic History   Marital status: Single    Spouse name: n/a   Number of children: 0   Years of education: Not on file   Highest education level: Not on file  Occupational History   Occupation: student    Comment: [employer]  Tobacco Use   Smoking status: Never   Smokeless tobacco: Current  Vaping Use   Vaping status: Never Used  Substance and Sexual Activity   Alcohol use: Yes    Comment: weekly   Drug use: No   Sexual activity: Yes    Birth control/protection: Condom  Other Topics Concern   Not on file  Social History Narrative   Lives with both parents and his sister in the same household.   Social Drivers  of Corporate Investment Banker Strain: Not on file  Food Insecurity: Not on file  Transportation Needs: Not on file  Physical Activity: Not on file  Stress: Not on file  Social Connections: Not on file  Intimate Partner Violence: Not on file   Family History  Problem Relation Age of Onset   Arthritis Maternal Grandfather    Hyperlipidemia Paternal Grandmother    Hypertension Paternal Grandmother    Stroke Paternal Grandmother    Hypertension Paternal Grandfather    Healthy Mother    Healthy Father     Objective: Office vital signs reviewed. BP 125/68   Pulse (!) 58   Temp 98.6 F (37 C)   Ht 6' 1 (1.854 m)   Wt 227 lb (103 kg)   SpO2 97%   BMI 29.95 kg/m   Physical Examination:  General: Awake, alert, well nourished, No acute distress HEENT: sclera white, MMM Cardio: Bradycardic but regular rhythm.  S1S2 heard, no murmurs appreciated Pulm: clear to auscultation bilaterally, no wheezes, rhonchi or rales; normal work of breathing on room air    Assessment/ Plan: 23 y.o. male   Precordial catch syndrome - Plan: LONG TERM MONITOR XT (3-14 DAYS)   I suspect this is precordial chest pain and I reassured him that this is common in his age group and is not cardiac related.  Given absence of cardiovascular disease in the family and overall health,  I think that cardiac workup is unneeded but I did go ahead and place a Zio patch should he just want to proceed with evaluation from an anxiety standpoint.  He was satisfied with normal blood pressure and discussion about precordial catch syndrome.  No further interventions are planned   Jonathan CHRISTELLA Fielding, DO [facility] [phone]

## 2024-01-09 ENCOUNTER — Telehealth (INDEPENDENT_AMBULATORY_CARE_PROVIDER_SITE_OTHER): Admitting: Family

## 2024-01-09 ENCOUNTER — Encounter: Payer: Self-pay | Admitting: Family

## 2024-01-09 DIAGNOSIS — L255 Unspecified contact dermatitis due to plants, except food: Secondary | ICD-10-CM | POA: Diagnosis not present

## 2024-01-09 MED ORDER — TRIAMCINOLONE ACETONIDE 0.1 % EX CREA: 1.0000 | TOPICAL_CREAM | Freq: Two times a day (BID) | CUTANEOUS | 0 refills | Status: DC

## 2024-01-09 MED ORDER — PREDNISONE 10 MG (21) PO TBPK: ORAL_TABLET | ORAL | 0 refills | Status: DC

## 2024-01-09 NOTE — Progress Notes (Signed)
 " Virtual Visit Consent   Jonathan Ford, you are scheduled for a virtual visit with a [facility] provider today. Just as with appointments in the office, your consent must be obtained to participate. Your consent will be active for this visit and any virtual visit you may have with one of our providers in the next 365 days. If you have a MyChart account, a copy of this consent can be sent to you electronically.  As this is a virtual visit, video technology does not allow for your provider to perform a traditional examination. This may limit your provider's ability to fully assess your condition. If your provider identifies any concerns that need to be evaluated in person or the need to arrange testing (such as labs, EKG, etc.), we will make arrangements to do so. Although advances in technology are sophisticated, we cannot ensure that it will always work on either your end or our end. If the connection with a video visit is poor, the visit may have to be switched to a telephone visit. With either a video or telephone visit, we are not always able to ensure that we have a secure connection.  By engaging in this virtual visit, you consent to the provision of healthcare and authorize for your insurance to be billed (if applicable) for the services provided during this visit. Depending on your insurance coverage, you may receive a charge related to this service.  I need to obtain your verbal consent now. Are you willing to proceed with your visit today? Jonathan Ford has provided verbal consent on 01/09/2024 for a virtual visit (video or telephone). Bari Learn, FNP  Date: 01/09/2024 10:32 AM   Virtual Visit via Video Note   I, Bari Learn, connected with  Jonathan Ford  ([id number], [birth year]) on 01/09/2024 at 10:25 AM EST by a video-enabled telemedicine application and verified that I am speaking with the correct person using two identifiers.  Location: Patient: Virtual Visit Location Patient:  Mobile Provider: Virtual Visit Location Provider: Home Office   I discussed the limitations of evaluation and management by telemedicine and the availability of in person appointments. The patient expressed understanding and agreed to proceed.    History of Present Illness: Jonathan Ford is a 23 y.o. who identifies as a male who was assigned male at birth, and is being seen today for rash that started Friday after hunting. States he noticed the rash on his right ear and hand on Saturday. Yesterday, he noticed it has spread to his thighs and groin, and genitalia area.   HPI: Rash This is a new problem. The affected locations include the right ear, right hand, genitalia and groin. The rash is characterized by itchiness and redness. He was exposed to plant contact. Past treatments include topical steroids. The treatment provided mild relief.    Problems:  Patient Active Problem List   Diagnosis Date Noted   History of concussion 12/24/2015    Allergies: Allergies[1] Medications: Current Medications[2]  Observations/Objective: Patient is well-developed, well-nourished in no acute distress.  Resting comfortably in car.  Head is normocephalic, atraumatic.  No labored breathing.  Speech is clear and coherent with logical content.  Patient is alert and oriented at baseline.  Erythemas papule rash on right ear lobe and hand   Assessment and Plan: 1. Contact dermatitis due to plant (Primary) - predniSONE  (STERAPRED UNI-PAK 21 TAB) 10 MG (21) TBPK tablet; Use as directed  Dispense: 21 tablet; Refill: 0 - triamcinolone  cream (KENALOG ) 0.1 %; Apply 1  Application topically 2 (two) times daily.  Dispense: 30 g; Refill: 0  Avoid scratching  Keep clean and dry Start prednisone  and kenalog   Report any s/s of infection  Follow up if symptoms worsen or do not improve   Follow Up Instructions: I discussed the assessment and treatment plan with the patient. The patient was provided an opportunity  to ask questions and all were answered. The patient agreed with the plan and demonstrated an understanding of the instructions.  A copy of instructions were sent to the patient via MyChart unless otherwise noted below.     The patient was advised to call back or seek an in-person evaluation if the symptoms worsen or if the condition fails to improve as anticipated.    Bari Learn, FNP    [1] No Known Allergies [2]  Current Outpatient Medications:    predniSONE  (STERAPRED UNI-PAK 21 TAB) 10 MG (21) TBPK tablet, Use as directed, Disp: 21 tablet, Rfl: 0   triamcinolone  cream (KENALOG ) 0.1 %, Apply 1 Application topically 2 (two) times daily., Disp: 30 g, Rfl: 0  "

## 2024-01-16 LAB — LAB REPORT - SCANNED: HM HIV Screening: NEGATIVE

## 2024-01-22 ENCOUNTER — Ambulatory Visit

## 2024-01-23 ENCOUNTER — Encounter: Payer: Self-pay | Admitting: Family Medicine

## 2024-01-23 ENCOUNTER — Ambulatory Visit: Admitting: Family Medicine

## 2024-01-23 VITALS — BP 124/77 | HR 56 | Temp 97.6°F | Ht 73.0 in | Wt 220.2 lb

## 2024-01-23 DIAGNOSIS — R112 Nausea with vomiting, unspecified: Secondary | ICD-10-CM

## 2024-01-23 DIAGNOSIS — N12 Tubulo-interstitial nephritis, not specified as acute or chronic: Secondary | ICD-10-CM

## 2024-01-23 DIAGNOSIS — N179 Acute kidney failure, unspecified: Secondary | ICD-10-CM

## 2024-01-23 DIAGNOSIS — R3129 Other microscopic hematuria: Secondary | ICD-10-CM | POA: Diagnosis not present

## 2024-01-23 DIAGNOSIS — R10A3 Flank pain, bilateral: Secondary | ICD-10-CM

## 2024-01-23 DIAGNOSIS — Z09 Encounter for follow-up examination after completed treatment for conditions other than malignant neoplasm: Secondary | ICD-10-CM | POA: Diagnosis not present

## 2024-01-23 LAB — URINALYSIS, ROUTINE W REFLEX MICROSCOPIC
Bilirubin, UA: NEGATIVE
Glucose, UA: NEGATIVE
Ketones, UA: NEGATIVE
Leukocytes,UA: NEGATIVE
Nitrite, UA: NEGATIVE
Protein,UA: NEGATIVE
RBC, UA: NEGATIVE
Specific Gravity, UA: 1.015 (ref 1.005–1.030)
Urobilinogen, Ur: 0.2 mg/dL (ref 0.2–1.0)
pH, UA: 7 (ref 5.0–7.5)

## 2024-01-23 NOTE — Progress Notes (Signed)
 "    Subjective:  Patient ID: Jonathan Ford, male    DOB: 2000/11/15, 24 y.o.   MRN: [id number]  Patient Care Team: Jolinda Norene HERO, DO as PCP - General (Family Medicine)   Chief Complaint:  Hospitalization Follow-up ([facility]- kidney failure )   HPI: Jonathan Ford is a 24 y.o. male presenting on 01/23/2024 for Hospitalization Follow-up ([facility]- kidney failure )    Jonathan Ford is a 24 year old male who presents with a recent bacterial kidney infection and acute kidney injury.  He was recently hospitalized for approximately 30 hours due to a bacterial kidney infection and acute kidney injury. During his hospital stay, he received IV fluids and antibiotics and was discharged with a prescription for Keflex , which he continues to take. He is unsure of how many days of the antibiotic course remain.  His symptoms began while duck hunting, with a sudden onset of severe bilateral flank pain, initially thought to be a back injury. This was followed by nausea and vomiting, prompting him to seek care at an urgent care facility. Initial workup included x-rays and blood work, after which he was advised to go to the ER due to concerning lab results. At the ER, he was told he had a kidney infection, AKI, and dehydration.  He has a history of poor water intake and occasional energy drink consumption. He had previously used creatine but stopped five months ago. Since discharge, he has increased his water intake significantly, although he experienced a migraine and nausea after initially drinking only water. He has since incorporated some caffeine and sugar back into his diet to alleviate these symptoms.  No prior urinary symptoms such as burning or frequency before the onset of his current illness. No history of sexually transmitted infections, and recent testing for STIs was negative. He did not experience any urinary discharge.  Since the hospital discharge, he has been experiencing  fatigue but the flank pain has resolved. He has been urinating normally without any visible blood in his urine. He acknowledges holding his bladder for extended periods during his hunting trip, which may have contributed to his condition.      Urgent care and hospital records reviewed and scanned in chart.     Relevant past medical, surgical, family, and social history reviewed and updated as indicated.  Allergies and medications reviewed and updated. Data reviewed: Chart in Epic.   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    Past Surgical History:  Procedure Laterality Date   PILONIDAL CYST EXCISION N/A 07/21/2021   Procedure: CYST EXCISION PILONIDAL SIMPLE;  Surgeon: Mavis Anes, MD;  Location: [facility];  Service: General;  Laterality: N/A;    Social History   Socioeconomic History   Marital status: Single    Spouse name: n/a   Number of children: 0   Years of education: Not on file   Highest education level: Not on file  Occupational History   Occupation: student    Comment: [employer]  Tobacco Use   Smoking status: Never   Smokeless tobacco: Current  Vaping Use   Vaping status: Never Used  Substance and Sexual Activity   Alcohol use: Yes    Comment: weekly   Drug use: No   Sexual activity: Yes    Birth control/protection: Condom  Other Topics Concern   Not on file  Social History Narrative   Lives with both parents and his sister in the same  household.   Social Drivers of Health   Tobacco Use: High Risk (01/23/2024)   Patient History    Smoking Tobacco Use: Never    Smokeless Tobacco Use: Current    Passive Exposure: Not on file  Financial Resource Strain: Not on file  Food Insecurity: Not on file  Transportation Needs: Not on file  Physical Activity: Not on file  Stress: Not on file  Social Connections: Not on file  Intimate Partner Violence: Not on file  Depression (PHQ2-9): Low Risk (08/25/2023)    Depression (PHQ2-9)    PHQ-2 Score: 2  Alcohol Screen: Not on file  Housing: Not on file  Utilities: Not on file  Health Literacy: Not on file    Outpatient Encounter Medications as of 01/23/2024  Medication Sig   cephALEXin  (KEFLEX ) 500 MG capsule Take 500 mg by mouth every 6 (six) hours.   [DISCONTINUED] predniSONE  (STERAPRED UNI-PAK 21 TAB) 10 MG (21) TBPK tablet Use as directed   [DISCONTINUED] triamcinolone  cream (KENALOG ) 0.1 % Apply 1 Application topically 2 (two) times daily.   No facility-administered encounter medications on file as of 01/23/2024.    Allergies[1]  Pertinent ROS per HPI, otherwise unremarkable      Objective:  BP 124/77   Pulse (!) 56   Temp 97.6 F (36.4 C)   Ht 6' 1 (1.854 m)   Wt 220 lb 3.2 oz (99.9 kg)   SpO2 99%   BMI 29.05 kg/m    Wt Readings from Last 3 Encounters:  01/23/24 220 lb 3.2 oz (99.9 kg)  12/27/23 227 lb (103 kg)  12/20/23 223 lb (101.2 kg)    Physical Exam Vitals and nursing note reviewed.  Constitutional:      General: He is not in acute distress.    Appearance: Normal appearance. He is well-developed and well-groomed. He is not ill-appearing, toxic-appearing or diaphoretic.  HENT:     Head: Normocephalic and atraumatic.     Jaw: There is normal jaw occlusion.     Right Ear: Hearing normal.     Left Ear: Hearing normal.     Nose: Nose normal.     Mouth/Throat:     Lips: Pink.     Mouth: Mucous membranes are moist.     Pharynx: Oropharynx is clear. Uvula midline.  Eyes:     General: Lids are normal.     Extraocular Movements: Extraocular movements intact.     Conjunctiva/sclera: Conjunctivae normal.     Pupils: Pupils are equal, round, and reactive to light.  Neck:     Thyroid : No thyroid  mass, thyromegaly or thyroid  tenderness.     Vascular: No carotid bruit or JVD.     Trachea: Trachea and phonation normal.  Cardiovascular:     Rate and Rhythm: Normal rate and regular rhythm.     Chest Wall: PMI is not  displaced.     Pulses: Normal pulses.     Heart sounds: Normal heart sounds. No murmur heard.    No friction rub. No gallop.  Pulmonary:     Effort: Pulmonary effort is normal. No respiratory distress.     Breath sounds: Normal breath sounds. No wheezing.  Abdominal:     General: Bowel sounds are normal. There is no distension or abdominal bruit.     Palpations: Abdomen is soft. There is no hepatomegaly or splenomegaly.     Tenderness: There is no abdominal tenderness. There is no right CVA tenderness or left CVA tenderness.     Hernia:  No hernia is present.  Musculoskeletal:        General: Normal range of motion.     Cervical back: Normal range of motion and neck supple.     Right lower leg: No edema.     Left lower leg: No edema.  Lymphadenopathy:     Cervical: No cervical adenopathy.  Skin:    General: Skin is warm and dry.     Capillary Refill: Capillary refill takes less than 2 seconds.     Coloration: Skin is not cyanotic, jaundiced or pale.     Findings: No rash.  Neurological:     General: No focal deficit present.     Mental Status: He is alert and oriented to person, place, and time.     Sensory: Sensation is intact.     Motor: Motor function is intact.     Coordination: Coordination is intact.     Gait: Gait is intact.     Deep Tendon Reflexes: Reflexes are normal and symmetric.  Psychiatric:        Attention and Perception: Attention and perception normal.        Mood and Affect: Mood and affect normal.        Speech: Speech normal.        Behavior: Behavior normal. Behavior is cooperative.        Thought Content: Thought content normal.        Cognition and Memory: Cognition and memory normal.        Judgment: Judgment normal.       Results for orders placed or performed in visit on 01/20/23  Urine Culture   Collection Time: 01/20/23  2:24 PM   Specimen: Urine   UR  Result Value Ref Range   Urine Culture, Routine Final report    Organism ID, Bacteria  Comment   Urinalysis, Complete   Collection Time: 01/20/23  2:24 PM  Result Value Ref Range   Specific Gravity, UA 1.015 1.005 - 1.030   pH, UA 6.0 5.0 - 7.5   Color, UA Yellow Yellow   Appearance Ur Clear Clear   Leukocytes,UA Negative Negative   Protein,UA Negative Negative/Trace   Glucose, UA Negative Negative   Ketones, UA Negative Negative   RBC, UA Negative Negative   Bilirubin, UA Negative Negative   Urobilinogen, Ur 0.2 0.2 - 1.0 mg/dL   Nitrite, UA Negative Negative   Microscopic Examination CANCELED        Pertinent labs & imaging results that were available during my care of the patient were reviewed by me and considered in my medical decision making.  Assessment & Plan:  Jonathan Ford was seen today for hospitalization follow-up.  Diagnoses and all orders for this visit:  Hospital discharge follow-up -     CBC with Differential/Platelet -     CMP14+EGFR -     Urinalysis, Routine w reflex microscopic  Pyelonephritis -     CBC with Differential/Platelet -     CMP14+EGFR -     Urinalysis, Routine w reflex microscopic  AKI (acute kidney injury) -     CBC with Differential/Platelet -     CMP14+EGFR -     Urinalysis, Routine w reflex microscopic  Nausea and vomiting in adult -     CBC with Differential/Platelet -     CMP14+EGFR -     Urinalysis, Routine w reflex microscopic  Other microscopic hematuria -     CBC with Differential/Platelet -     CMP14+EGFR -  Urinalysis, Routine w reflex microscopic  Bilateral flank pain -     CBC with Differential/Platelet -     CMP14+EGFR -     Urinalysis, Routine w reflex microscopic      Acute pyelonephritis with acute kidney injury Recent hospitalization for acute pyelonephritis with acute kidney injury, likely triggered by dehydration and holding urine during a hunting trip. Initial creatinine was 2.5, trending down to 1.6, which remains elevated for age. Symptoms included bilateral flank pain, nausea, and vomiting.  Currently on Keflex . No recent urinary tract infection symptoms or STDs. No current flank pain, but fatigue persists. Adequate hydration since discharge, though recent migraine and nausea affected fluid intake. - Ordered urine analysis to check for resolved hematuria. - Ordered repeat blood work to assess kidney function and hemoglobin levels. - Referred to Nephrology if creatinine remains elevated. - Advised increased water intake and avoidance of NSAIDs to protect kidney function. - Recommended Tylenol  for pain management. - Advised limiting caffeine intake to one drink per day.      I spent 40 minutes dedicated to the care of this patient on the date of this encounter to include pre-visit review of records including diagnostic studies and labs, face-to-face time with the patient discussing above conditions, post visit ordering of testing, clinical documentation in the electronic health record, making appropriate referrals as documented, and communicating necessary information to the patient's healthcare team.      Continue all other maintenance medications.  Follow up plan: Return if symptoms worsen or fail to improve.   Continue healthy lifestyle choices, including diet (rich in fruits, vegetables, and lean proteins, and low in salt and simple carbohydrates) and exercise (at least 30 minutes of moderate physical activity daily).    The above assessment and management plan was discussed with the patient. The patient verbalized understanding of and has agreed to the management plan. Patient is aware to call the clinic if they develop any new symptoms or if symptoms persist or worsen. Patient is aware when to return to the clinic for a follow-up visit. Patient educated on when it is appropriate to go to the emergency department.   Rosaline Bruns, FNP-C [facility] [phone]     [1] No Known Allergies  "

## 2024-01-24 ENCOUNTER — Ambulatory Visit: Payer: Self-pay | Admitting: Family Medicine

## 2024-01-24 LAB — CBC WITH DIFFERENTIAL/PLATELET
Basophils Absolute: 0 x10E3/uL (ref 0.0–0.2)
Basos: 1 %
EOS (ABSOLUTE): 0.1 x10E3/uL (ref 0.0–0.4)
Eos: 1 %
Hematocrit: 46.1 % (ref 37.5–51.0)
Hemoglobin: 15.3 g/dL (ref 13.0–17.7)
Immature Grans (Abs): 0.1 x10E3/uL (ref 0.0–0.1)
Immature Granulocytes: 1 %
Lymphocytes Absolute: 1.9 x10E3/uL (ref 0.7–3.1)
Lymphs: 32 %
MCH: 29 pg (ref 26.6–33.0)
MCHC: 33.2 g/dL (ref 31.5–35.7)
MCV: 88 fL (ref 79–97)
Monocytes Absolute: 0.4 x10E3/uL (ref 0.1–0.9)
Monocytes: 7 %
Neutrophils Absolute: 3.4 x10E3/uL (ref 1.4–7.0)
Neutrophils: 58 %
Platelets: 247 x10E3/uL (ref 150–450)
RBC: 5.27 x10E6/uL (ref 4.14–5.80)
RDW: 13.5 % (ref 11.6–15.4)
WBC: 5.9 x10E3/uL (ref 3.4–10.8)

## 2024-01-24 LAB — CMP14+EGFR
ALT: 17 IU/L (ref 0–44)
AST: 15 IU/L (ref 0–40)
Albumin: 4.5 g/dL (ref 4.3–5.2)
Alkaline Phosphatase: 108 IU/L (ref 47–123)
BUN/Creatinine Ratio: 9 (ref 9–20)
BUN: 11 mg/dL (ref 6–20)
Bilirubin Total: 0.2 mg/dL (ref 0.0–1.2)
CO2: 25 mmol/L (ref 20–29)
Calcium: 9.6 mg/dL (ref 8.7–10.2)
Chloride: 103 mmol/L (ref 96–106)
Creatinine, Ser: 1.17 mg/dL (ref 0.76–1.27)
Globulin, Total: 2 g/dL (ref 1.5–4.5)
Glucose: 85 mg/dL (ref 70–99)
Potassium: 4.3 mmol/L (ref 3.5–5.2)
Sodium: 142 mmol/L (ref 134–144)
Total Protein: 6.5 g/dL (ref 6.0–8.5)
eGFR: 90 mL/min/1.73
